# Patient Record
Sex: Male | Born: 1950 | Race: White | Hispanic: No | Smoking: Current every day smoker
Health system: Southern US, Community
[De-identification: ages and names within clinical notes are randomized; demographics above are authoritative.]

## PROBLEM LIST (undated history)

## (undated) DIAGNOSIS — IMO0002 Reserved for concepts with insufficient information to code with codable children: Secondary | ICD-10-CM

## (undated) DIAGNOSIS — H269 Unspecified cataract: Secondary | ICD-10-CM

## (undated) DIAGNOSIS — F32A Depression, unspecified: Secondary | ICD-10-CM

## (undated) DIAGNOSIS — Z5189 Encounter for other specified aftercare: Secondary | ICD-10-CM

## (undated) DIAGNOSIS — H409 Unspecified glaucoma: Secondary | ICD-10-CM

## (undated) DIAGNOSIS — M1990 Unspecified osteoarthritis, unspecified site: Secondary | ICD-10-CM

## (undated) DIAGNOSIS — K219 Gastro-esophageal reflux disease without esophagitis: Secondary | ICD-10-CM

## (undated) DIAGNOSIS — I10 Essential (primary) hypertension: Secondary | ICD-10-CM

## (undated) DIAGNOSIS — T7840XA Allergy, unspecified, initial encounter: Secondary | ICD-10-CM

## (undated) DIAGNOSIS — G8929 Other chronic pain: Secondary | ICD-10-CM

## (undated) DIAGNOSIS — N189 Chronic kidney disease, unspecified: Secondary | ICD-10-CM

## (undated) DIAGNOSIS — G600 Hereditary motor and sensory neuropathy: Secondary | ICD-10-CM

## (undated) HISTORY — DX: Reserved for concepts with insufficient information to code with codable children: IMO0002

## (undated) HISTORY — DX: Encounter for other specified aftercare: Z51.89

## (undated) HISTORY — DX: Allergy, unspecified, initial encounter: T78.40XA

## (undated) HISTORY — PX: NECK SURGERY: SHX720

## (undated) HISTORY — DX: Unspecified glaucoma: H40.9

## (undated) HISTORY — PX: ADENOIDECTOMY: SUR15

## (undated) HISTORY — PX: FOOT SURGERY: SHX648

## (undated) HISTORY — DX: Unspecified osteoarthritis, unspecified site: M19.90

## (undated) HISTORY — DX: Unspecified cataract: H26.9

## (undated) HISTORY — PX: TONSILLECTOMY: SUR1361

## (undated) HISTORY — PX: FRACTURE SURGERY: SHX138

## (undated) HISTORY — PX: HERNIA REPAIR: SHX51

---

## 2005-08-09 ENCOUNTER — Encounter: Admission: RE | Admit: 2005-08-09 | Discharge: 2005-11-07 | Payer: Self-pay | Admitting: Psychiatry

## 2005-08-20 ENCOUNTER — Ambulatory Visit: Payer: Self-pay | Admitting: Internal Medicine

## 2005-09-05 ENCOUNTER — Ambulatory Visit: Payer: Self-pay | Admitting: Internal Medicine

## 2005-09-19 ENCOUNTER — Ambulatory Visit: Payer: Self-pay | Admitting: Internal Medicine

## 2005-09-26 ENCOUNTER — Ambulatory Visit: Payer: Self-pay | Admitting: Internal Medicine

## 2005-10-26 ENCOUNTER — Ambulatory Visit: Payer: Self-pay | Admitting: Internal Medicine

## 2005-10-26 ENCOUNTER — Ambulatory Visit (HOSPITAL_COMMUNITY): Admission: RE | Admit: 2005-10-26 | Discharge: 2005-10-26 | Payer: Self-pay | Admitting: Internal Medicine

## 2005-12-12 ENCOUNTER — Ambulatory Visit: Payer: Self-pay | Admitting: Internal Medicine

## 2005-12-19 ENCOUNTER — Ambulatory Visit: Payer: Self-pay | Admitting: Internal Medicine

## 2005-12-26 ENCOUNTER — Ambulatory Visit: Payer: Self-pay | Admitting: Internal Medicine

## 2005-12-31 ENCOUNTER — Ambulatory Visit (HOSPITAL_COMMUNITY): Admission: RE | Admit: 2005-12-31 | Discharge: 2005-12-31 | Payer: Self-pay | Admitting: Internal Medicine

## 2006-01-23 ENCOUNTER — Ambulatory Visit: Payer: Self-pay | Admitting: Internal Medicine

## 2006-08-22 ENCOUNTER — Ambulatory Visit (HOSPITAL_COMMUNITY): Admission: RE | Admit: 2006-08-22 | Discharge: 2006-08-22 | Payer: Self-pay | Admitting: Internal Medicine

## 2006-08-22 ENCOUNTER — Ambulatory Visit: Payer: Self-pay | Admitting: Internal Medicine

## 2006-08-30 ENCOUNTER — Ambulatory Visit (HOSPITAL_COMMUNITY): Admission: RE | Admit: 2006-08-30 | Discharge: 2006-08-30 | Payer: Self-pay | Admitting: Internal Medicine

## 2006-10-24 ENCOUNTER — Ambulatory Visit: Payer: Self-pay | Admitting: Internal Medicine

## 2007-07-17 ENCOUNTER — Encounter: Payer: Self-pay | Admitting: *Deleted

## 2007-07-17 DIAGNOSIS — F3289 Other specified depressive episodes: Secondary | ICD-10-CM | POA: Insufficient documentation

## 2007-07-17 DIAGNOSIS — K219 Gastro-esophageal reflux disease without esophagitis: Secondary | ICD-10-CM | POA: Insufficient documentation

## 2007-07-17 DIAGNOSIS — D649 Anemia, unspecified: Secondary | ICD-10-CM

## 2007-07-17 DIAGNOSIS — F329 Major depressive disorder, single episode, unspecified: Secondary | ICD-10-CM | POA: Insufficient documentation

## 2007-07-17 DIAGNOSIS — Z9189 Other specified personal risk factors, not elsewhere classified: Secondary | ICD-10-CM | POA: Insufficient documentation

## 2013-04-14 ENCOUNTER — Other Ambulatory Visit: Payer: Self-pay | Admitting: Pain Medicine

## 2013-04-14 DIAGNOSIS — M79605 Pain in left leg: Secondary | ICD-10-CM

## 2013-04-14 DIAGNOSIS — M545 Low back pain: Secondary | ICD-10-CM

## 2013-04-27 ENCOUNTER — Ambulatory Visit
Admission: RE | Admit: 2013-04-27 | Discharge: 2013-04-27 | Disposition: A | Discharge status: Home / Self Care | Payer: Medicare Other | Source: Ambulatory Visit | Attending: Pain Medicine | Admitting: Pain Medicine

## 2013-04-27 DIAGNOSIS — M545 Low back pain: Secondary | ICD-10-CM

## 2013-04-27 DIAGNOSIS — M79604 Pain in right leg: Secondary | ICD-10-CM

## 2014-07-05 DIAGNOSIS — M48061 Spinal stenosis, lumbar region without neurogenic claudication: Secondary | ICD-10-CM | POA: Insufficient documentation

## 2014-07-05 DIAGNOSIS — M51369 Other intervertebral disc degeneration, lumbar region without mention of lumbar back pain or lower extremity pain: Secondary | ICD-10-CM | POA: Insufficient documentation

## 2014-07-05 DIAGNOSIS — H9201 Otalgia, right ear: Secondary | ICD-10-CM | POA: Insufficient documentation

## 2014-10-29 DIAGNOSIS — R233 Spontaneous ecchymoses: Secondary | ICD-10-CM | POA: Insufficient documentation

## 2015-01-05 DIAGNOSIS — G8929 Other chronic pain: Secondary | ICD-10-CM | POA: Insufficient documentation

## 2015-07-12 ENCOUNTER — Encounter: Payer: Self-pay | Admitting: Family Medicine

## 2015-07-29 ENCOUNTER — Encounter: Payer: Self-pay | Admitting: Family Medicine

## 2016-02-06 DIAGNOSIS — M5430 Sciatica, unspecified side: Secondary | ICD-10-CM | POA: Diagnosis not present

## 2016-02-06 DIAGNOSIS — N200 Calculus of kidney: Secondary | ICD-10-CM | POA: Diagnosis not present

## 2016-02-06 DIAGNOSIS — R197 Diarrhea, unspecified: Secondary | ICD-10-CM | POA: Diagnosis not present

## 2016-02-06 DIAGNOSIS — G4700 Insomnia, unspecified: Secondary | ICD-10-CM | POA: Diagnosis not present

## 2016-03-19 DIAGNOSIS — Z1211 Encounter for screening for malignant neoplasm of colon: Secondary | ICD-10-CM | POA: Diagnosis not present

## 2016-06-26 DIAGNOSIS — G600 Hereditary motor and sensory neuropathy: Secondary | ICD-10-CM | POA: Diagnosis not present

## 2016-06-26 DIAGNOSIS — G4700 Insomnia, unspecified: Secondary | ICD-10-CM | POA: Diagnosis not present

## 2016-06-26 DIAGNOSIS — M5430 Sciatica, unspecified side: Secondary | ICD-10-CM | POA: Diagnosis not present

## 2016-06-26 DIAGNOSIS — R030 Elevated blood-pressure reading, without diagnosis of hypertension: Secondary | ICD-10-CM | POA: Diagnosis not present

## 2016-07-11 DIAGNOSIS — Z23 Encounter for immunization: Secondary | ICD-10-CM | POA: Diagnosis not present

## 2016-08-24 DIAGNOSIS — M810 Age-related osteoporosis without current pathological fracture: Secondary | ICD-10-CM | POA: Diagnosis not present

## 2016-08-24 DIAGNOSIS — G8929 Other chronic pain: Secondary | ICD-10-CM | POA: Diagnosis not present

## 2016-08-24 DIAGNOSIS — G479 Sleep disorder, unspecified: Secondary | ICD-10-CM | POA: Diagnosis not present

## 2016-08-24 DIAGNOSIS — G600 Hereditary motor and sensory neuropathy: Secondary | ICD-10-CM | POA: Diagnosis not present

## 2016-08-24 DIAGNOSIS — I10 Essential (primary) hypertension: Secondary | ICD-10-CM | POA: Diagnosis not present

## 2016-08-24 DIAGNOSIS — Z136 Encounter for screening for cardiovascular disorders: Secondary | ICD-10-CM | POA: Diagnosis not present

## 2016-08-24 DIAGNOSIS — N400 Enlarged prostate without lower urinary tract symptoms: Secondary | ICD-10-CM | POA: Diagnosis not present

## 2017-02-22 DIAGNOSIS — K219 Gastro-esophageal reflux disease without esophagitis: Secondary | ICD-10-CM | POA: Diagnosis not present

## 2017-02-22 DIAGNOSIS — R030 Elevated blood-pressure reading, without diagnosis of hypertension: Secondary | ICD-10-CM | POA: Diagnosis not present

## 2017-02-22 DIAGNOSIS — G894 Chronic pain syndrome: Secondary | ICD-10-CM | POA: Diagnosis not present

## 2017-02-22 DIAGNOSIS — R944 Abnormal results of kidney function studies: Secondary | ICD-10-CM | POA: Diagnosis not present

## 2017-02-22 DIAGNOSIS — M5417 Radiculopathy, lumbosacral region: Secondary | ICD-10-CM | POA: Diagnosis not present

## 2017-02-22 DIAGNOSIS — R296 Repeated falls: Secondary | ICD-10-CM | POA: Diagnosis not present

## 2017-04-01 ENCOUNTER — Other Ambulatory Visit: Payer: Self-pay | Admitting: Internal Medicine

## 2017-04-01 DIAGNOSIS — M810 Age-related osteoporosis without current pathological fracture: Secondary | ICD-10-CM | POA: Diagnosis not present

## 2017-04-01 DIAGNOSIS — N400 Enlarged prostate without lower urinary tract symptoms: Secondary | ICD-10-CM | POA: Diagnosis not present

## 2017-04-01 DIAGNOSIS — R030 Elevated blood-pressure reading, without diagnosis of hypertension: Secondary | ICD-10-CM | POA: Diagnosis not present

## 2017-04-01 DIAGNOSIS — N281 Cyst of kidney, acquired: Secondary | ICD-10-CM | POA: Diagnosis not present

## 2017-04-01 DIAGNOSIS — N433 Hydrocele, unspecified: Secondary | ICD-10-CM | POA: Diagnosis not present

## 2017-04-01 DIAGNOSIS — R296 Repeated falls: Secondary | ICD-10-CM | POA: Diagnosis not present

## 2017-04-01 DIAGNOSIS — G8929 Other chronic pain: Secondary | ICD-10-CM | POA: Diagnosis not present

## 2017-04-01 DIAGNOSIS — K219 Gastro-esophageal reflux disease without esophagitis: Secondary | ICD-10-CM | POA: Diagnosis not present

## 2017-04-04 ENCOUNTER — Other Ambulatory Visit: Payer: Medicare Other

## 2017-04-09 ENCOUNTER — Ambulatory Visit
Admission: RE | Admit: 2017-04-09 | Discharge: 2017-04-09 | Disposition: A | Discharge status: Home / Self Care | Payer: Medicare Other | Source: Ambulatory Visit | Attending: Internal Medicine | Admitting: Internal Medicine

## 2017-04-09 DIAGNOSIS — N433 Hydrocele, unspecified: Secondary | ICD-10-CM

## 2017-04-19 DIAGNOSIS — G600 Hereditary motor and sensory neuropathy: Secondary | ICD-10-CM | POA: Diagnosis not present

## 2017-04-19 DIAGNOSIS — Z79899 Other long term (current) drug therapy: Secondary | ICD-10-CM | POA: Diagnosis not present

## 2017-04-19 DIAGNOSIS — G894 Chronic pain syndrome: Secondary | ICD-10-CM | POA: Diagnosis not present

## 2017-04-19 DIAGNOSIS — M545 Low back pain: Secondary | ICD-10-CM | POA: Diagnosis not present

## 2017-04-19 DIAGNOSIS — Z79891 Long term (current) use of opiate analgesic: Secondary | ICD-10-CM | POA: Diagnosis not present

## 2017-04-23 DIAGNOSIS — N430 Encysted hydrocele: Secondary | ICD-10-CM | POA: Diagnosis not present

## 2017-06-20 DIAGNOSIS — G8929 Other chronic pain: Secondary | ICD-10-CM | POA: Diagnosis not present

## 2017-06-20 DIAGNOSIS — G600 Hereditary motor and sensory neuropathy: Secondary | ICD-10-CM | POA: Diagnosis not present

## 2017-06-20 DIAGNOSIS — K219 Gastro-esophageal reflux disease without esophagitis: Secondary | ICD-10-CM | POA: Diagnosis not present

## 2017-08-19 DIAGNOSIS — Z23 Encounter for immunization: Secondary | ICD-10-CM | POA: Diagnosis not present

## 2017-08-19 DIAGNOSIS — K219 Gastro-esophageal reflux disease without esophagitis: Secondary | ICD-10-CM | POA: Diagnosis not present

## 2017-08-19 DIAGNOSIS — M810 Age-related osteoporosis without current pathological fracture: Secondary | ICD-10-CM | POA: Diagnosis not present

## 2017-08-19 DIAGNOSIS — G8929 Other chronic pain: Secondary | ICD-10-CM | POA: Diagnosis not present

## 2017-08-19 DIAGNOSIS — N400 Enlarged prostate without lower urinary tract symptoms: Secondary | ICD-10-CM | POA: Diagnosis not present

## 2017-08-19 DIAGNOSIS — G600 Hereditary motor and sensory neuropathy: Secondary | ICD-10-CM | POA: Diagnosis not present

## 2017-08-19 DIAGNOSIS — E785 Hyperlipidemia, unspecified: Secondary | ICD-10-CM | POA: Diagnosis not present

## 2017-08-19 DIAGNOSIS — Z0000 Encounter for general adult medical examination without abnormal findings: Secondary | ICD-10-CM | POA: Diagnosis not present

## 2017-08-19 DIAGNOSIS — N433 Hydrocele, unspecified: Secondary | ICD-10-CM | POA: Diagnosis not present

## 2017-08-19 DIAGNOSIS — R296 Repeated falls: Secondary | ICD-10-CM | POA: Diagnosis not present

## 2017-08-22 ENCOUNTER — Ambulatory Visit (INDEPENDENT_AMBULATORY_CARE_PROVIDER_SITE_OTHER): Payer: Medicare Other

## 2017-08-22 ENCOUNTER — Ambulatory Visit (INDEPENDENT_AMBULATORY_CARE_PROVIDER_SITE_OTHER): Payer: Medicare Other | Admitting: Orthopaedic Surgery

## 2017-08-22 ENCOUNTER — Ambulatory Visit (INDEPENDENT_AMBULATORY_CARE_PROVIDER_SITE_OTHER): Payer: Self-pay

## 2017-08-22 DIAGNOSIS — M25561 Pain in right knee: Secondary | ICD-10-CM | POA: Diagnosis not present

## 2017-08-22 DIAGNOSIS — M25551 Pain in right hip: Secondary | ICD-10-CM | POA: Diagnosis not present

## 2017-08-22 DIAGNOSIS — M25562 Pain in left knee: Secondary | ICD-10-CM

## 2017-08-22 DIAGNOSIS — G8929 Other chronic pain: Secondary | ICD-10-CM

## 2017-08-22 DIAGNOSIS — M25552 Pain in left hip: Secondary | ICD-10-CM

## 2017-08-22 NOTE — Progress Notes (Signed)
Office Visit Note   Patient: Manuel Castro           Date of Birth: 1951/11/07           MRN: [id number] Visit Date: 08/22/2017              Requested by: Lorenda Ishihara, MD [address] [place], [place] [address] PCP: Lorenda Ishihara, MD   Assessment & Plan: Visit Diagnoses:  1. Chronic pain of both knees   2. Pain of both hip joints     Plan: Overall impression is right hip abductor tendinitis and right knee medial meniscal degenerative tear. Patient will continue doing home exercises with the hip. He declined a cortisone injection with the right knee. Questions encouraged and answered. Follow-up as needed.  Follow-Up Instructions: Return if symptoms worsen or fail to improve.   Orders:  Orders Placed This Encounter  Procedures  . XR Pelvis 1-2 Views  . XR KNEE 3 VIEW LEFT  . XR KNEE 3 VIEW RIGHT   No orders of the defined types were placed in this encounter.     Procedures: No procedures performed   Clinical Data: No additional findings.   Subjective: No chief complaint on file.   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 Terms of right hip he endorses pain in the abductor region that is worse with walking. Denies any numbness or tingling. He denies any groin pain. He also endorses medial right knee pain. Denies any numbness and tingling. Pain is worse with activity and with walking.    Review of Systems  Constitutional: Negative.   All other systems reviewed and are negative.    Objective: Vital Signs: There were no vitals taken for this visit.  Physical Exam  Constitutional: He is oriented to person, place, and time. He appears well-developed and well-nourished.  HENT:  Head: Normocephalic and atraumatic.  Eyes: Pupils are equal, round, and reactive to light.  Neck: Neck supple.  Pulmonary/Chest: Effort normal.  Abdominal: Soft.    Musculoskeletal: Normal range of motion.  Neurological: He is alert and oriented to person, place, and time.  Skin: Skin is warm.  Psychiatric: He has a normal mood and affect. His behavior is normal. Judgment and thought content normal.  Nursing note and vitals reviewed.   Ortho Exam Right hip exam shows painless range of motion of the hip. Trochanteric bursa is nontender. He is more tender with deep palpation in the abductor muscles.  Right knee exam shows no joint effusion. Excellent range of motion. Medial joint line tenderness. Collaterals and cruciates are stable. Specialty Comments:  No specialty comments available.  Imaging: Xr Knee 3 View Left  Result Date: 08/22/2017 No significant degenerative joint disease  Xr Knee 3 View Right  Result Date: 08/22/2017 No significant degenerative joint disease  Xr Pelvis 1-2 Views  Result Date: 08/22/2017 Mild osteoarthritis    PMFS History: Patient Active Problem List   Diagnosis Date Noted  . Chronic pain of both knees 08/22/2017  . Pain of both hip joints 08/22/2017  . ANEMIA-NOS 07/17/2007  . DEPRESSION 07/17/2007  . GERD 07/17/2007  . BREAST BIOPSY, HX OF 07/17/2007   No past medical history on file.  No family history on file.  No past surgical history on file. Social History   Occupational History  . Not on file.   Social History Main Topics  . Smoking status: Not on  file  . Smokeless tobacco: Not on file  . Alcohol use Not on file  . Drug use: Unknown  . Sexual activity: Not on file

## 2017-09-09 DIAGNOSIS — R12 Heartburn: Secondary | ICD-10-CM | POA: Diagnosis not present

## 2017-09-09 DIAGNOSIS — K219 Gastro-esophageal reflux disease without esophagitis: Secondary | ICD-10-CM | POA: Diagnosis not present

## 2017-10-24 DIAGNOSIS — K2980 Duodenitis without bleeding: Secondary | ICD-10-CM | POA: Diagnosis not present

## 2017-10-24 DIAGNOSIS — K319 Disease of stomach and duodenum, unspecified: Secondary | ICD-10-CM | POA: Diagnosis not present

## 2017-10-24 DIAGNOSIS — K219 Gastro-esophageal reflux disease without esophagitis: Secondary | ICD-10-CM | POA: Diagnosis not present

## 2017-10-24 DIAGNOSIS — R12 Heartburn: Secondary | ICD-10-CM | POA: Diagnosis not present

## 2017-10-24 DIAGNOSIS — K269 Duodenal ulcer, unspecified as acute or chronic, without hemorrhage or perforation: Secondary | ICD-10-CM | POA: Diagnosis not present

## 2017-10-24 DIAGNOSIS — K2900 Acute gastritis without bleeding: Secondary | ICD-10-CM | POA: Diagnosis not present

## 2017-10-24 DIAGNOSIS — K3189 Other diseases of stomach and duodenum: Secondary | ICD-10-CM | POA: Diagnosis not present

## 2017-10-24 LAB — HM COLONOSCOPY

## 2017-10-29 DIAGNOSIS — K319 Disease of stomach and duodenum, unspecified: Secondary | ICD-10-CM | POA: Diagnosis not present

## 2019-04-08 DIAGNOSIS — Z7189 Other specified counseling: Secondary | ICD-10-CM | POA: Diagnosis not present

## 2019-04-08 DIAGNOSIS — N401 Enlarged prostate with lower urinary tract symptoms: Secondary | ICD-10-CM | POA: Diagnosis not present

## 2019-04-08 DIAGNOSIS — K259 Gastric ulcer, unspecified as acute or chronic, without hemorrhage or perforation: Secondary | ICD-10-CM | POA: Diagnosis not present

## 2019-04-08 DIAGNOSIS — M25551 Pain in right hip: Secondary | ICD-10-CM | POA: Insufficient documentation

## 2019-04-08 DIAGNOSIS — F331 Major depressive disorder, recurrent, moderate: Secondary | ICD-10-CM | POA: Diagnosis not present

## 2019-04-08 DIAGNOSIS — F3341 Major depressive disorder, recurrent, in partial remission: Secondary | ICD-10-CM | POA: Insufficient documentation

## 2019-04-08 DIAGNOSIS — G600 Hereditary motor and sensory neuropathy: Secondary | ICD-10-CM | POA: Diagnosis not present

## 2019-04-08 DIAGNOSIS — M170 Bilateral primary osteoarthritis of knee: Secondary | ICD-10-CM | POA: Diagnosis not present

## 2019-04-08 DIAGNOSIS — M48061 Spinal stenosis, lumbar region without neurogenic claudication: Secondary | ICD-10-CM | POA: Diagnosis not present

## 2019-04-08 DIAGNOSIS — N400 Enlarged prostate without lower urinary tract symptoms: Secondary | ICD-10-CM | POA: Insufficient documentation

## 2019-04-08 DIAGNOSIS — H409 Unspecified glaucoma: Secondary | ICD-10-CM | POA: Insufficient documentation

## 2019-07-15 DIAGNOSIS — G4700 Insomnia, unspecified: Secondary | ICD-10-CM | POA: Insufficient documentation

## 2019-07-15 DIAGNOSIS — F17200 Nicotine dependence, unspecified, uncomplicated: Secondary | ICD-10-CM | POA: Insufficient documentation

## 2019-07-22 DIAGNOSIS — G7100 Muscular dystrophy, unspecified: Secondary | ICD-10-CM | POA: Insufficient documentation

## 2019-12-05 ENCOUNTER — Ambulatory Visit: Payer: Medicare Other | Attending: Internal Medicine

## 2019-12-05 DIAGNOSIS — Z23 Encounter for immunization: Secondary | ICD-10-CM

## 2019-12-05 NOTE — Progress Notes (Signed)
   Covid-19 Vaccination Clinic  Name:  Manuel Castro    MRN: [id number] DOB: 02/24/1951  12/05/2019  Manuel Castro was observed post Covid-19 immunization for 15 minutes without incidence. He was provided with Vaccine Information Sheet and instruction to access the V-Safe system.   Manuel Castro was instructed to call 911 with any severe reactions post vaccine: Marland Kitchen Difficulty breathing  . Swelling of your face and throat  . A fast heartbeat  . A bad rash all over your body  . Dizziness and weakness    Immunizations Administered    Name Date Dose VIS Date Route   Pfizer COVID-19 Vaccine 12/05/2019  1:18 PM 0.3 mL 10/23/2019 Intramuscular   Manufacturer: [employer], [employer]   Lot: [id number]   NDC: [address]-2

## 2019-12-26 ENCOUNTER — Ambulatory Visit: Payer: Medicare HMO | Attending: Internal Medicine

## 2019-12-26 DIAGNOSIS — Z23 Encounter for immunization: Secondary | ICD-10-CM | POA: Insufficient documentation

## 2019-12-26 NOTE — Progress Notes (Signed)
   Covid-19 Vaccination Clinic  Name:  Manuel Castro    MRN: [id number] DOB: 11/06/1951  12/26/2019  Manuel Castro was observed post Covid-19 immunization for 15 minutes without incidence. He was provided with Vaccine Information Sheet and instruction to access the V-Safe system.   Manuel Castro was instructed to call 911 with any severe reactions post vaccine: Marland Kitchen Difficulty breathing  . Swelling of your face and throat  . A fast heartbeat  . A bad rash all over your body  . Dizziness and weakness    Immunizations Administered    Name Date Dose VIS Date Route   Pfizer COVID-19 Vaccine 12/26/2019 11:28 AM 0.3 mL 10/23/2019 Intramuscular   Manufacturer: [employer], [employer]   Lot: [id number]   NDC: [address]-8

## 2020-01-11 DIAGNOSIS — G629 Polyneuropathy, unspecified: Secondary | ICD-10-CM | POA: Insufficient documentation

## 2020-01-11 DIAGNOSIS — Z9181 History of falling: Secondary | ICD-10-CM | POA: Insufficient documentation

## 2020-01-12 DIAGNOSIS — N1831 Chronic kidney disease, stage 3a: Secondary | ICD-10-CM | POA: Insufficient documentation

## 2020-06-22 DIAGNOSIS — D72819 Decreased white blood cell count, unspecified: Secondary | ICD-10-CM | POA: Insufficient documentation

## 2020-07-01 ENCOUNTER — Other Ambulatory Visit: Payer: Self-pay | Admitting: Family Medicine

## 2020-07-01 DIAGNOSIS — M48061 Spinal stenosis, lumbar region without neurogenic claudication: Secondary | ICD-10-CM

## 2020-07-19 ENCOUNTER — Other Ambulatory Visit: Payer: Self-pay | Admitting: Family Medicine

## 2020-07-21 ENCOUNTER — Other Ambulatory Visit: Payer: Self-pay | Admitting: Family Medicine

## 2020-07-23 ENCOUNTER — Ambulatory Visit
Admission: RE | Admit: 2020-07-23 | Discharge: 2020-07-23 | Disposition: A | Discharge status: Home / Self Care | Payer: Medicare HMO | Source: Ambulatory Visit | Attending: Family Medicine | Admitting: Family Medicine

## 2020-07-23 DIAGNOSIS — M48061 Spinal stenosis, lumbar region without neurogenic claudication: Secondary | ICD-10-CM

## 2020-07-23 MED ORDER — GADOBENATE DIMEGLUMINE 529 MG/ML IV SOLN
12.0000 mL | Freq: Once | INTRAVENOUS | Status: AC | PRN
  Administered 2020-07-23: 12 mL via INTRAVENOUS

## 2020-07-26 DIAGNOSIS — N261 Atrophy of kidney (terminal): Secondary | ICD-10-CM | POA: Insufficient documentation

## 2020-12-28 DIAGNOSIS — E860 Dehydration: Secondary | ICD-10-CM | POA: Insufficient documentation

## 2021-01-20 DIAGNOSIS — I10 Essential (primary) hypertension: Secondary | ICD-10-CM | POA: Insufficient documentation

## 2021-02-07 ENCOUNTER — Emergency Department (HOSPITAL_COMMUNITY): Payer: Medicare HMO

## 2021-02-07 ENCOUNTER — Other Ambulatory Visit: Payer: Self-pay

## 2021-02-07 ENCOUNTER — Emergency Department (HOSPITAL_COMMUNITY)
Admission: EM | Admit: 2021-02-07 | Discharge: 2021-02-07 | Disposition: A | Discharge status: Home / Self Care | Payer: Medicare HMO | Attending: Emergency Medicine | Admitting: Emergency Medicine

## 2021-02-07 ENCOUNTER — Encounter (HOSPITAL_COMMUNITY): Payer: Self-pay

## 2021-02-07 DIAGNOSIS — R748 Abnormal levels of other serum enzymes: Secondary | ICD-10-CM | POA: Insufficient documentation

## 2021-02-07 DIAGNOSIS — R1031 Right lower quadrant pain: Secondary | ICD-10-CM | POA: Diagnosis present

## 2021-02-07 DIAGNOSIS — K859 Acute pancreatitis without necrosis or infection, unspecified: Secondary | ICD-10-CM | POA: Diagnosis not present

## 2021-02-07 DIAGNOSIS — F17210 Nicotine dependence, cigarettes, uncomplicated: Secondary | ICD-10-CM | POA: Insufficient documentation

## 2021-02-07 HISTORY — DX: Hereditary motor and sensory neuropathy: G60.0

## 2021-02-07 LAB — COMPREHENSIVE METABOLIC PANEL
ALT: 11 U/L (ref 0–44)
AST: 25 U/L (ref 15–41)
Albumin: 3 g/dL — ABNORMAL LOW (ref 3.5–5.0)
Alkaline Phosphatase: 114 U/L (ref 38–126)
Anion gap: 9 (ref 5–15)
BUN: 23 mg/dL (ref 8–23)
CO2: 25 mmol/L (ref 22–32)
Calcium: 8.5 mg/dL — ABNORMAL LOW (ref 8.9–10.3)
Chloride: 104 mmol/L (ref 98–111)
Creatinine, Ser: 1.14 mg/dL (ref 0.61–1.24)
GFR, Estimated: >60 mL/min (ref >60)
Glucose, Bld: 112 mg/dL — ABNORMAL HIGH (ref 70–99)
Potassium: 4.5 mmol/L (ref 3.5–5.1)
Sodium: 138 mmol/L (ref 135–145)
Total Bilirubin: 1 mg/dL (ref 0.3–1.2)
Total Protein: 6.2 g/dL — ABNORMAL LOW (ref 6.5–8.1)

## 2021-02-07 LAB — I-STAT CHEM 8, ED
BUN: 21 mg/dL (ref 8–23)
Calcium, Ion: 1.23 mmol/L (ref 1.15–1.40)
Chloride: 103 mmol/L (ref 98–111)
Creatinine, Ser: 0.9 mg/dL (ref 0.61–1.24)
Glucose, Bld: 101 mg/dL — ABNORMAL HIGH (ref 70–99)
HCT: 31 % — ABNORMAL LOW (ref 39.0–52.0)
Hemoglobin: 10.5 g/dL — ABNORMAL LOW (ref 13.0–17.0)
Potassium: 4.2 mmol/L (ref 3.5–5.1)
Sodium: 138 mmol/L (ref 135–145)
TCO2: 25 mmol/L (ref 22–32)

## 2021-02-07 LAB — URINALYSIS, ROUTINE W REFLEX MICROSCOPIC
Bilirubin Urine: NEGATIVE
Glucose, UA: NEGATIVE mg/dL
Hgb urine dipstick: NEGATIVE
Ketones, ur: 5 mg/dL — AB
Leukocytes,Ua: NEGATIVE
Nitrite: NEGATIVE
Protein, ur: 100 mg/dL — AB
Specific Gravity, Urine: 1.044 — ABNORMAL HIGH (ref 1.005–1.030)
pH: 6 (ref 5.0–8.0)

## 2021-02-07 LAB — CBC
HCT: 33.3 % — ABNORMAL LOW (ref 39.0–52.0)
Hemoglobin: 11.1 g/dL — ABNORMAL LOW (ref 13.0–17.0)
MCH: 32.2 pg (ref 26.0–34.0)
MCHC: 33.3 g/dL (ref 30.0–36.0)
MCV: 96.5 fL (ref 80.0–100.0)
Platelets: 202 10*3/uL (ref 150–400)
RBC: 3.45 MIL/uL — ABNORMAL LOW (ref 4.22–5.81)
RDW: 12.9 % (ref 11.5–15.5)
WBC: 6.7 10*3/uL (ref 4.0–10.5)
nRBC: 0 % (ref 0.0–0.2)

## 2021-02-07 LAB — LIPASE, BLOOD: Lipase: 158 U/L — ABNORMAL HIGH (ref 11–51)

## 2021-02-07 MED ORDER — MORPHINE SULFATE (PF) 4 MG/ML IV SOLN
4.0000 mg | Freq: Once | INTRAVENOUS | Status: AC
  Administered 2021-02-07: 4 mg via INTRAVENOUS
  Filled 2021-02-07: qty 1

## 2021-02-07 MED ORDER — IOHEXOL 300 MG/ML  SOLN
100.0000 mL | Freq: Once | INTRAMUSCULAR | Status: AC | PRN
  Administered 2021-02-07: 100 mL via INTRAVENOUS

## 2021-02-07 MED ORDER — ONDANSETRON 4 MG PO TBDP: ORAL_TABLET | ORAL | 0 refills | Status: DC

## 2021-02-07 MED ORDER — MORPHINE SULFATE 15 MG PO TABS
7.5000 mg | ORAL_TABLET | ORAL | 0 refills | Status: DC | PRN
Start: 2021-02-07 — End: 2021-02-22

## 2021-02-07 MED ORDER — ONDANSETRON HCL 4 MG/2ML IJ SOLN
4.0000 mg | Freq: Once | INTRAMUSCULAR | Status: AC
  Administered 2021-02-07: 4 mg via INTRAVENOUS
  Filled 2021-02-07: qty 2

## 2021-02-07 NOTE — ED Triage Notes (Signed)
Patient reports lower abdominal pain x 1 week, but RLQ worse today and nausea only today.

## 2021-02-07 NOTE — Discharge Instructions (Addendum)
Your CT scan and blood work was concerning for pancreatitis.  There is also some concern by the radiologist for possible cancer in the right lower portion of your abdomen, they also thought that this could be stool.  Please follow-up with your gastroenterologist for both problems.  As we had discussed this is something that sometimes will require you to come to the hospital if it worsens.  Please return at anytime you feel he need to stay.  Especially return for worsening pain or inability to eat or drink or fever.  I would stop your tramadol while you are taking the pain medicine I prescribed you.  Try and take max dose Tylenol along with this which would be 1000 mg or 2 extra strength tablets 4 times a day.

## 2021-02-07 NOTE — ED Notes (Signed)
Went to collect urine sample however patient is gone for testing. 

## 2021-02-07 NOTE — ED Provider Notes (Signed)
[facility]-EMERGENCY DEPT Provider Note   CSN: [id number] Arrival date & time: 02/07/21  4010     History Chief Complaint  Patient presents with  . Abdominal Pain  . Nausea    Manuel Castro is a 70 y.o. male.  70 yo M with a chief complaints of abdominal pain.  Going on for 3 or 4 days.  Pain initially was crampy associated with some nausea and vomiting.  Pain now is in the right side and having some continued nausea.  Denied history of pain like this before.  No sick contacts.  Concerned that he may have appendicitis.  Denies urinary symptoms denies pain to the penis or testicles.  The history is provided by the patient.  Abdominal Pain Pain location:  RLQ Pain quality: sharp and shooting   Pain radiates to:  Does not radiate Pain severity:  Moderate Onset quality:  Gradual Duration:  4 days Timing:  Constant Chronicity:  New Relieved by:  Nothing Worsened by:  Nothing Ineffective treatments:  None tried Associated symptoms: nausea and vomiting   Associated symptoms: no chest pain, no chills, no diarrhea, no fever and no shortness of breath        Past Medical History:  Diagnosis Date  . CMT (Charcot-Marie-Tooth disease)     Patient Active Problem List   Diagnosis Date Noted  . Chronic pain of both knees 08/22/2017  . Pain of both hip joints 08/22/2017  . ANEMIA-NOS 07/17/2007  . DEPRESSION 07/17/2007  . GERD 07/17/2007  . BREAST BIOPSY, HX OF 07/17/2007    Past Surgical History:  Procedure Laterality Date  . ADENOIDECTOMY    . FOOT SURGERY Bilateral   . HERNIA REPAIR    . NECK SURGERY    . TONSILLECTOMY         Family History  Problem Relation Age of Onset  . Cancer Mother   . Heart attack Mother   . Diabetes Father   . Heart attack Father     Social History   Tobacco Use  . Smoking status: Current Every Day Smoker    Packs/day: 0.35    Types: Cigarettes  . Smokeless tobacco: Never Used  Vaping Use  . Vaping Use:  Never used  Substance Use Topics  . Alcohol use: Yes  . Drug use: Not Currently    Home Medications Prior to Admission medications   Medication Sig Start Date End Date Taking? Authorizing Provider  morphine (MSIR) 15 MG tablet Take 0.5 tablets (7.5 mg total) by mouth every 4 (four) hours as needed for severe pain. 02/07/21  Yes Melene Plan, DO  ondansetron (ZOFRAN ODT) 4 MG disintegrating tablet 4mg  ODT q4 hours prn nausea/vomit 02/07/21  Yes 02/09/21, Ramiah Helfrich, DO  amLODipine (NORVASC) 5 MG tablet Take 5 mg by mouth daily. 01/20/21   [provider]  dorzolamide-timolol (COSOPT) 22.3-6.8 MG/ML ophthalmic solution Place 1 drop into both eyes 2 (two) times daily. 01/16/21   [provider]  gabapentin (NEURONTIN) 600 MG tablet Take 600 mg by mouth 3 (three) times daily. 11/18/20   [provider]  traMADol (ULTRAM) 50 MG tablet Take 50 mg by mouth 3 (three) times daily as needed for pain. 01/17/21   [provider]  traZODone (DESYREL) 100 MG tablet Take 100 mg by mouth at bedtime. 01/16/21   [provider]    Allergies    Patient has no known allergies.  Review of Systems   Review of Systems  Constitutional: Negative for  chills and fever.  HENT: Negative for congestion and facial swelling.   Eyes: Negative for discharge and visual disturbance.  Respiratory: Negative for shortness of breath.   Cardiovascular: Negative for chest pain and palpitations.  Gastrointestinal: Positive for abdominal pain, nausea and vomiting. Negative for diarrhea.  Musculoskeletal: Negative for arthralgias and myalgias.  Skin: Negative for color change and rash.  Neurological: Negative for tremors, syncope and headaches.  Psychiatric/Behavioral: Negative for confusion and dysphoric mood.    Physical Exam Updated Vital Signs BP (!) 172/83   Pulse (!) 109   Temp 98.3 F (36.8 C) (Oral)   Resp 18   Ht 5\' 8"  (1.727 m)   Wt 59 kg   SpO2 98%   BMI 19.77 kg/m   Physical  Exam Vitals and nursing note reviewed.  Constitutional:      Appearance: He is well-developed.  HENT:     Head: Normocephalic and atraumatic.  Eyes:     Pupils: Pupils are equal, round, and reactive to light.  Neck:     Vascular: No JVD.  Cardiovascular:     Rate and Rhythm: Normal rate and regular rhythm.     Heart sounds: No murmur heard. No friction rub. No gallop.   Pulmonary:     Effort: No respiratory distress.     Breath sounds: No wheezing.  Abdominal:     General: There is no distension.     Tenderness: There is abdominal tenderness in the right lower quadrant. There is no guarding or rebound.  Musculoskeletal:        General: Normal range of motion.     Cervical back: Normal range of motion and neck supple.  Skin:    Coloration: Skin is not pale.     Findings: No rash.  Neurological:     Mental Status: He is alert and oriented to person, place, and time.  Psychiatric:        Behavior: Behavior normal.     ED Results / Procedures / Treatments   Labs (all labs ordered are listed, but only abnormal results are displayed) Labs Reviewed  LIPASE, BLOOD - Abnormal; Notable for the following components:      Result Value   Lipase 158 (*)    All other components within normal limits  COMPREHENSIVE METABOLIC PANEL - Abnormal; Notable for the following components:   Glucose, Bld 112 (*)    Calcium 8.5 (*)    Total Protein 6.2 (*)    Albumin 3.0 (*)    All other components within normal limits  CBC - Abnormal; Notable for the following components:   RBC 3.45 (*)    Hemoglobin 11.1 (*)    HCT 33.3 (*)    All other components within normal limits  URINALYSIS, ROUTINE W REFLEX MICROSCOPIC - Abnormal; Notable for the following components:   Specific Gravity, Urine 1.044 (*)    Ketones, ur 5 (*)    Protein, ur 100 (*)    Bacteria, UA RARE (*)    All other components within normal limits  I-STAT CHEM 8, ED - Abnormal; Notable for the following components:   Glucose,  Bld 101 (*)    Hemoglobin 10.5 (*)    HCT 31.0 (*)    All other components within normal limits    EKG None  Radiology CT ABDOMEN PELVIS W CONTRAST  Result Date: 02/07/2021 CLINICAL DATA:  Right sided abdominal pain with nausea EXAM: CT ABDOMEN AND PELVIS WITH CONTRAST TECHNIQUE: Multidetector CT imaging of the  abdomen and pelvis was performed using the standard protocol following bolus administration of intravenous contrast. CONTRAST:  OMNIPAQUE IOHEXOL 300 MG/ML  SOLN COMPARISON:  Lumbar MRI July 23, 2020. FINDINGS: Lower chest: There is scarring and atelectatic change in the lung bases. No airspace consolidation in the lung bases. Hepatobiliary: No focal liver lesions are appreciable. Subtle lobulation along the contour of the liver noted. Gallbladder wall is not appreciably thickened. There is no appreciable biliary duct dilatation. Pancreas: There is mild dilatation of the pancreatic duct. There is a focal calcification in the mid pancreas. There is fluid located between the pancreas and lesser curvature of the stomach without well-defined pseudocyst. There is a 1 x 1 cm cystic appearing area at the junction of the head and body of the pancreas. Normal pancreatic enhancement throughout its course. Spleen: No splenic lesions are appreciable. 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 Adrenals/Urinary Tract: Adrenals bilaterally appear normal. The left kidney is markedly atrophic with localized dilatation of the left renal collecting system. There is a cyst arising from the upper pole of the atrophic left kidney measuring 1.2 x 1.2 cm. Right kidney shows a degree of compensatory hypertrophy. There is a cyst in the mid right kidney measuring 4.1 x 4.0 cm. There is a 9 x 8 mm cyst in the lower pole of the right kidney. There is a cyst arising in the mid to lower right kidney measuring 4.0 x 3.7 cm. No evident hydronephrosis on the right. No appreciable renal or  ureteral calculus on either side. Urinary bladder is midline with wall thickness within normal limits. Stomach/Bowel: There is moderate stool throughout the colon. There is a focal area of soft tissue opacity in the ascending colon measuring 4.9 x 3.6 cm. No similar appearance of this nature elsewhere in colon. There is no appreciable bowel wall or mesenteric thickening. No bowel obstruction is evident. No free air or portal venous air. The terminal ileum appears normal. Appendix appears normal. Vascular/Lymphatic: There is no abdominal aortic aneurysm. There are foci of aortic atherosclerosis. Major venous structures appear patent. No evident adenopathy in the abdomen or pelvis. Reproductive: Prostate and seminal vesicles normal in size and configuration. Right scrotal hydrocele incompletely visualized. Other: No evident abscess or ascites in the abdomen or pelvis. There is mild fat in the right inguinal ring. Musculoskeletal: There is degenerative change in the lumbar spine. There is mild anterior wedging of the L2 vertebral body, present on prior lumbar MR and grossly stable. No blastic or lytic bone lesions are evident. No intramuscular lesions. IMPRESSION: 1. Soft tissue opacity in the proximal ascending colon measuring 4.9 x 3.6 cm. While this area could represent inspissated stool, a colonic mass in this area cannot be excluded. Advise direct visualization of this region after appropriate colonic cleansing. 2. Findings indicative of pancreatitis with fluid tracking between the pancreas in the lesser curvature of the stomach. There is mild pancreatic duct dilatation. There is a 1 x 1 cm cystic appearing mass at the junction of the head and body of the pancreas. This finding likely represents a small pseudocyst. A small cystic neoplasm could present in this manner. This finding may warrant nonemergent MR of the pancreas pre and post-contrast to further evaluate. There is a focal calcification in the body of the  pancreas which may represent residua of chronic pancreatitis. No pancreatic necrosis evident. 3. Subtle lobulation of the liver contour raises question of underlying cirrhosis. No focal liver lesions evident.  4. Atrophic left kidney with mild hydronephrosis on the left. No obstructing focus noted in the left collecting system or ureter. Suspected compensatory hypertrophy right kidney. No right renal calculus or ureterectasis. Urinary bladder thickness within normal limits. 5.  Aortic Atherosclerosis (ICD10-I70.0). 6. 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 Stable wedging at L2. 7.  Incomplete visualization of right scrotal hydrocele. Electronically Signed   By: Bretta Bang III M.D.   On: 02/07/2021 12:39    Procedures Procedures   Medications Ordered in ED Medications  morphine 4 MG/ML injection 4 mg (4 mg Intravenous Given 02/07/21 1049)  ondansetron (ZOFRAN) injection 4 mg (4 mg Intravenous Given 02/07/21 1047)  iohexol (OMNIPAQUE) 300 MG/ML solution 100 mL (100 mLs Intravenous Contrast Given 02/07/21 1145)    ED Course  I have reviewed the triage vital signs and the nursing notes.  Pertinent labs & imaging results that were available during my care of the patient were reviewed by me and considered in my medical decision making (see chart for details).    MDM Rules/Calculators/A&P                          70 yo M with a chief complaints of right lower quadrant abdominal pain.  Ongoing for about 4 days now.  Will obtain blood work CT scan and reassess.    Blood work with elevated lipase level.  CT scan concerning for acute pancreatitis.  There is also a finding in the right lower quadrant that could be stool versus a mass.  I discussed the results with the patient.  He is feeling much better after his pain and nausea medicine here.  Would like to do a trial of home therapy and will follow up with GI in the office.  1:11 PM:  I have discussed the diagnosis/risks/treatment options with  the patient and believe the pt to be eligible for discharge home to follow-up with PCP, GI. We also discussed returning to the ED immediately if new or worsening sx occur. We discussed the sx which are most concerning (e.g., sudden worsening pain, fever, inability to tolerate by mouth) that necessitate immediate return. Medications administered to the patient during their visit and any new prescriptions provided to the patient are listed below.  Medications given during this visit Medications  morphine 4 MG/ML injection 4 mg (4 mg Intravenous Given 02/07/21 1049)  ondansetron (ZOFRAN) injection 4 mg (4 mg Intravenous Given 02/07/21 1047)  iohexol (OMNIPAQUE) 300 MG/ML solution 100 mL (100 mLs Intravenous Contrast Given 02/07/21 1145)     The patient appears reasonably screen and/or stabilized for discharge and I doubt any other medical condition or other [facility] requiring further screening, evaluation, or treatment in the ED at this time prior to discharge.    Final Clinical Impression(s) / ED Diagnoses Final diagnoses:  Acute pancreatitis, unspecified complication status, unspecified pancreatitis type    Rx / DC Orders ED Discharge Orders         Ordered    morphine (MSIR) 15 MG tablet  Every 4 hours PRN        02/07/21 1309    ondansetron (ZOFRAN ODT) 4 MG disintegrating tablet        02/07/21 1309           Melene Plan, DO 02/07/21 1311

## 2021-02-08 DIAGNOSIS — I700 Atherosclerosis of aorta: Secondary | ICD-10-CM | POA: Insufficient documentation

## 2021-02-08 DIAGNOSIS — K861 Other chronic pancreatitis: Secondary | ICD-10-CM | POA: Insufficient documentation

## 2021-02-15 DIAGNOSIS — E639 Nutritional deficiency, unspecified: Secondary | ICD-10-CM | POA: Insufficient documentation

## 2021-02-15 DIAGNOSIS — D849 Immunodeficiency, unspecified: Secondary | ICD-10-CM | POA: Insufficient documentation

## 2021-02-20 ENCOUNTER — Inpatient Hospital Stay (HOSPITAL_BASED_OUTPATIENT_CLINIC_OR_DEPARTMENT_OTHER)
Admission: EM | Admit: 2021-02-20 | Discharge: 2021-02-25 | DRG: 438 | Disposition: A | Discharge status: Home / Self Care | Payer: Medicare HMO | Attending: Internal Medicine | Admitting: Internal Medicine

## 2021-02-20 ENCOUNTER — Emergency Department (HOSPITAL_COMMUNITY): Payer: Medicare HMO

## 2021-02-20 ENCOUNTER — Emergency Department (HOSPITAL_COMMUNITY)
Admission: EM | Admit: 2021-02-20 | Discharge: 2021-02-20 | Discharge status: Against Medical Advice | Payer: Medicare HMO | Source: Home / Self Care | Attending: Emergency Medicine | Admitting: Emergency Medicine

## 2021-02-20 ENCOUNTER — Encounter (HOSPITAL_COMMUNITY): Payer: Self-pay

## 2021-02-20 ENCOUNTER — Other Ambulatory Visit: Payer: Self-pay

## 2021-02-20 DIAGNOSIS — Z681 Body mass index (BMI) 19 or less, adult: Secondary | ICD-10-CM

## 2021-02-20 DIAGNOSIS — R1031 Right lower quadrant pain: Secondary | ICD-10-CM | POA: Insufficient documentation

## 2021-02-20 DIAGNOSIS — Z20822 Contact with and (suspected) exposure to covid-19: Secondary | ICD-10-CM | POA: Diagnosis present

## 2021-02-20 DIAGNOSIS — K2900 Acute gastritis without bleeding: Secondary | ICD-10-CM | POA: Diagnosis present

## 2021-02-20 DIAGNOSIS — M25562 Pain in left knee: Secondary | ICD-10-CM | POA: Diagnosis present

## 2021-02-20 DIAGNOSIS — K863 Pseudocyst of pancreas: Secondary | ICD-10-CM | POA: Diagnosis present

## 2021-02-20 DIAGNOSIS — R110 Nausea: Secondary | ICD-10-CM | POA: Insufficient documentation

## 2021-02-20 DIAGNOSIS — E876 Hypokalemia: Secondary | ICD-10-CM | POA: Diagnosis not present

## 2021-02-20 DIAGNOSIS — D62 Acute posthemorrhagic anemia: Secondary | ICD-10-CM | POA: Diagnosis present

## 2021-02-20 DIAGNOSIS — Z833 Family history of diabetes mellitus: Secondary | ICD-10-CM

## 2021-02-20 DIAGNOSIS — Z809 Family history of malignant neoplasm, unspecified: Secondary | ICD-10-CM

## 2021-02-20 DIAGNOSIS — K317 Polyp of stomach and duodenum: Secondary | ICD-10-CM | POA: Diagnosis present

## 2021-02-20 DIAGNOSIS — R109 Unspecified abdominal pain: Secondary | ICD-10-CM | POA: Diagnosis present

## 2021-02-20 DIAGNOSIS — K921 Melena: Secondary | ICD-10-CM | POA: Diagnosis present

## 2021-02-20 DIAGNOSIS — I10 Essential (primary) hypertension: Secondary | ICD-10-CM | POA: Diagnosis present

## 2021-02-20 DIAGNOSIS — F17210 Nicotine dependence, cigarettes, uncomplicated: Secondary | ICD-10-CM | POA: Diagnosis present

## 2021-02-20 DIAGNOSIS — Z8249 Family history of ischemic heart disease and other diseases of the circulatory system: Secondary | ICD-10-CM

## 2021-02-20 DIAGNOSIS — E872 Acidosis: Secondary | ICD-10-CM | POA: Diagnosis present

## 2021-02-20 DIAGNOSIS — G600 Hereditary motor and sensory neuropathy: Secondary | ICD-10-CM | POA: Diagnosis not present

## 2021-02-20 DIAGNOSIS — R64 Cachexia: Secondary | ICD-10-CM | POA: Diagnosis present

## 2021-02-20 DIAGNOSIS — E43 Unspecified severe protein-calorie malnutrition: Secondary | ICD-10-CM | POA: Diagnosis present

## 2021-02-20 DIAGNOSIS — D72829 Elevated white blood cell count, unspecified: Secondary | ICD-10-CM | POA: Diagnosis present

## 2021-02-20 DIAGNOSIS — Z5321 Procedure and treatment not carried out due to patient leaving prior to being seen by health care provider: Secondary | ICD-10-CM | POA: Insufficient documentation

## 2021-02-20 DIAGNOSIS — M25561 Pain in right knee: Secondary | ICD-10-CM | POA: Diagnosis present

## 2021-02-20 DIAGNOSIS — G8929 Other chronic pain: Secondary | ICD-10-CM | POA: Diagnosis present

## 2021-02-20 DIAGNOSIS — R112 Nausea with vomiting, unspecified: Secondary | ICD-10-CM | POA: Diagnosis present

## 2021-02-20 DIAGNOSIS — D649 Anemia, unspecified: Secondary | ICD-10-CM | POA: Diagnosis present

## 2021-02-20 DIAGNOSIS — N179 Acute kidney failure, unspecified: Secondary | ICD-10-CM | POA: Diagnosis present

## 2021-02-20 DIAGNOSIS — E860 Dehydration: Secondary | ICD-10-CM | POA: Diagnosis present

## 2021-02-20 DIAGNOSIS — K859 Acute pancreatitis without necrosis or infection, unspecified: Principal | ICD-10-CM | POA: Diagnosis present

## 2021-02-20 DIAGNOSIS — E639 Nutritional deficiency, unspecified: Secondary | ICD-10-CM | POA: Insufficient documentation

## 2021-02-20 HISTORY — DX: Other chronic pain: G89.29

## 2021-02-20 HISTORY — DX: Essential (primary) hypertension: I10

## 2021-02-20 HISTORY — DX: Chronic kidney disease, unspecified: N18.9

## 2021-02-20 HISTORY — DX: Depression, unspecified: F32.A

## 2021-02-20 HISTORY — DX: Gastro-esophageal reflux disease without esophagitis: K21.9

## 2021-02-20 LAB — COMPREHENSIVE METABOLIC PANEL
ALT: 16 U/L (ref 0–44)
AST: 14 U/L — ABNORMAL LOW (ref 15–41)
Albumin: 2.1 g/dL — ABNORMAL LOW (ref 3.5–5.0)
Alkaline Phosphatase: 168 U/L — ABNORMAL HIGH (ref 38–126)
Anion gap: 22 — ABNORMAL HIGH (ref 5–15)
BUN: 19 mg/dL (ref 8–23)
CO2: 11 mmol/L — ABNORMAL LOW (ref 22–32)
Calcium: 8.2 mg/dL — ABNORMAL LOW (ref 8.9–10.3)
Chloride: 106 mmol/L (ref 98–111)
Creatinine, Ser: 1.52 mg/dL — ABNORMAL HIGH (ref 0.61–1.24)
GFR, Estimated: 49 mL/min — ABNORMAL LOW (ref >60)
Glucose, Bld: 97 mg/dL (ref 70–99)
Potassium: 3.4 mmol/L — ABNORMAL LOW (ref 3.5–5.1)
Sodium: 139 mmol/L (ref 135–145)
Total Bilirubin: 2.6 mg/dL — ABNORMAL HIGH (ref 0.3–1.2)
Total Protein: 5.7 g/dL — ABNORMAL LOW (ref 6.5–8.1)

## 2021-02-20 LAB — RESP PANEL BY RT-PCR (FLU A&B, COVID) ARPGX2
Influenza A by PCR: NEGATIVE
Influenza B by PCR: NEGATIVE
SARS Coronavirus 2 by RT PCR: NEGATIVE

## 2021-02-20 LAB — CBC
HCT: 29 % — ABNORMAL LOW (ref 39.0–52.0)
Hemoglobin: 9.1 g/dL — ABNORMAL LOW (ref 13.0–17.0)
MCH: 31.1 pg (ref 26.0–34.0)
MCHC: 31.4 g/dL (ref 30.0–36.0)
MCV: 99 fL (ref 80.0–100.0)
Platelets: 729 10*3/uL — ABNORMAL HIGH (ref 150–400)
RBC: 2.93 MIL/uL — ABNORMAL LOW (ref 4.22–5.81)
RDW: 14.2 % (ref 11.5–15.5)
WBC: 14.4 10*3/uL — ABNORMAL HIGH (ref 4.0–10.5)
nRBC: 0 % (ref 0.0–0.2)

## 2021-02-20 LAB — TROPONIN I (HIGH SENSITIVITY): Troponin I (High Sensitivity): 20 ng/L — ABNORMAL HIGH (ref <18)

## 2021-02-20 LAB — LIPASE, BLOOD: Lipase: 712 U/L — ABNORMAL HIGH (ref 11–51)

## 2021-02-20 MED ORDER — ONDANSETRON HCL 4 MG/2ML IJ SOLN
4.0000 mg | Freq: Once | INTRAMUSCULAR | Status: AC
  Administered 2021-02-20: 4 mg via INTRAVENOUS
  Filled 2021-02-20: qty 2

## 2021-02-20 MED ORDER — IOHEXOL 300 MG/ML  SOLN
100.0000 mL | Freq: Once | INTRAMUSCULAR | Status: AC | PRN
  Administered 2021-02-20: 100 mL via INTRAVENOUS

## 2021-02-20 MED ORDER — HYDROMORPHONE HCL 1 MG/ML IJ SOLN
1.0000 mg | Freq: Once | INTRAMUSCULAR | Status: AC
Start: 2021-02-20 — End: 2021-02-20
  Administered 2021-02-20: 1 mg via INTRAVENOUS
  Filled 2021-02-20: qty 1

## 2021-02-20 MED ORDER — SODIUM CHLORIDE 0.9 % IV BOLUS
1000.0000 mL | Freq: Once | INTRAVENOUS | Status: AC
  Administered 2021-02-20: 1000 mL via INTRAVENOUS

## 2021-02-20 MED ORDER — MORPHINE SULFATE (PF) 4 MG/ML IV SOLN
4.0000 mg | Freq: Once | INTRAVENOUS | Status: AC
  Administered 2021-02-20: 4 mg via INTRAVENOUS
  Filled 2021-02-20: qty 1

## 2021-02-20 MED ORDER — LABETALOL HCL 5 MG/ML IV SOLN
10.0000 mg | Freq: Once | INTRAVENOUS | Status: AC
  Administered 2021-02-20: 10 mg via INTRAVENOUS
  Filled 2021-02-20: qty 4

## 2021-02-20 NOTE — ED Notes (Signed)
Blood pressure reported to provider, orders given.

## 2021-02-20 NOTE — ED Triage Notes (Signed)
Emergency Medicine Provider Triage Evaluation Note  Manuel Castro , a 70 y.o. male  was evaluated in triage.  Pt complains of chest pain, epigastric pain.  Has history of pancreatitis, feels similar.  He has no longer drinking alcohol.  He is on chronic pain medication for Charcot-Marie-Tooth syndrome.  He has had nausea, no recent vomiting.  He is unable to tolerate p.o. intake.  No syncope. Pain does not radiate into back  Still have gallbladder  Review of Systems  Positive: Chest pain, abdominal pain, nausea Negative: Back pain, shortness of breath, bloody stools  Physical Exam  BP (!) 178/72   Pulse 99   Temp 97.8 F (36.6 C) (Oral)   Resp (!) 22   SpO2 100%  Gen:   Awake, no distress   HEENT:  Atraumatic  Resp:  Normal effort  Cardiac:  Normal rate  Abd:   Nondistended, diffuse abdominal tenderness MSK:   Moves extremities without difficulty  Neuro:  Speech clear   Medical Decision Making  Medically screening exam initiated at 1:01 PM.  Appropriate orders placed.  Valeria Krisko was informed that the remainder of the evaluation will be completed by another provider, this initial triage assessment does not replace that evaluation, and the importance of remaining in the ED until their evaluation is complete.  Clinical Impression  Epigastric pain, CP   Nirvan Laban A, PA-C 02/20/21 1305

## 2021-02-20 NOTE — ED Triage Notes (Signed)
C/o epigastric pain x2 weeks. C/o nausea, gagging & diarrhea; denies vomiting. Denies fever. Hx of pancreatitis. CT scan at [facility] showed worsening of pancreatitis.

## 2021-02-20 NOTE — ED Notes (Signed)
Report given to [facility], RN at [facility]

## 2021-02-20 NOTE — Progress Notes (Signed)
Manuel Castro is a 70 y.o. male patient admitted. Awake, alert - oriented  X 4 - Pain 8/10 , right lower abdominal area.  VSS - Blood pressure (!) 184/96, pulse (!) 110, temperature 98.8 F (37.1 C), temperature source Oral, resp. rate 16, height 5\' 8"  (1.727 m), weight 56 kg, SpO2 100 %.    IV in place, occlusive dsg intact without redness.  Orientation to room, and floor completed.  Admission INP armband ID verified with patient/family, and in place.   SR up x 2, fall assessment complete, with patient and family able to verbalize understanding of risk associated with falls, and verbalized understanding to call nsg before up out of bed.  Call light within reach, patient able to voice, and demonstrate understanding. No evidence of skin break down noted on exam.  Admission nurse notified of admission.     Will cont to eval and treat per MD orders.  , RN 02/20/2021 11:35 PM

## 2021-02-20 NOTE — ED Notes (Signed)
Pt left due to wait time  

## 2021-02-20 NOTE — ED Provider Notes (Addendum)
[facility] EMERGENCY DEPT Provider Note   CSN: [id number] Arrival date & time: 02/20/21  2044     History Chief Complaint  Patient presents with  . Abdominal Pain    Manuel Castro is a 70 y.o. male.  Pt presents to the ED today with abdominal pain.  The pt initially presented to [facility], but waited for over 8 hrs and then came here.  The pt had labs and a CT scan while waiting.  Pt was initially diagnosed with pancreatitis on 3/29.  He has been unable to keep anything down and his pain is worse.  [facility] done [facility] showed worsening of his pancreatitis with pseudocyst formation.        Past Medical History:  Diagnosis Date  . CMT (Charcot-Marie-Tooth disease)     Patient Active Problem List   Diagnosis Date Noted  . Acute pancreatitis 02/20/2021  . Chronic pain of both knees 08/22/2017  . Pain of both hip joints 08/22/2017  . ANEMIA-NOS 07/17/2007  . DEPRESSION 07/17/2007  . GERD 07/17/2007  . BREAST BIOPSY, HX OF 07/17/2007    Past Surgical History:  Procedure Laterality Date  . ADENOIDECTOMY    . FOOT SURGERY Bilateral   . HERNIA REPAIR    . NECK SURGERY    . TONSILLECTOMY         Family History  Problem Relation Age of Onset  . Cancer Mother   . Heart attack Mother   . Diabetes Father   . Heart attack Father     Social History   Tobacco Use  . Smoking status: Current Every Day Smoker    Packs/day: 0.35    Types: Cigarettes  . Smokeless tobacco: Never Used  Vaping Use  . Vaping Use: Never used  Substance Use Topics  . Alcohol use: Yes  . Drug use: Not Currently    Home Medications Prior to Admission medications   Medication Sig Start Date End Date Taking? Authorizing Provider  amLODipine (NORVASC) 5 MG tablet Take 5 mg by mouth daily. 01/20/21   [provider]  dorzolamide-timolol (COSOPT) 22.3-6.8 MG/ML ophthalmic solution Place 1 drop into both eyes 2 (two) times daily. 01/16/21   [provider]  gabapentin  (NEURONTIN) 600 MG tablet Take 600 mg by mouth 3 (three) times daily. 11/18/20   [provider]  morphine (MSIR) 15 MG tablet Take 0.5 tablets (7.5 mg total) by mouth every 4 (four) hours as needed for severe pain. 02/07/21   Melene Plan, DO  ondansetron (ZOFRAN ODT) 4 MG disintegrating tablet 4mg  ODT q4 hours prn nausea/vomit 02/07/21   02/09/21, DO  traMADol (ULTRAM) 50 MG tablet Take 50 mg by mouth 3 (three) times daily as needed for pain. 01/17/21   [provider]  traZODone (DESYREL) 100 MG tablet Take 100 mg by mouth at bedtime. 01/16/21   [provider]    Allergies    Patient has no known allergies.  Review of Systems   Review of Systems  Gastrointestinal: Positive for abdominal pain, nausea and vomiting.  All other systems reviewed and are negative.   Physical Exam Updated Vital Signs BP (!) 201/101   Pulse (!) 117   Temp 99.1 F (37.3 C)   Resp 20   Ht 5\' 8"  (1.727 m)   Wt 58.2 kg   SpO2 100%   BMI 19.49 kg/m   Physical Exam Vitals and nursing note reviewed.  Constitutional:      Appearance: He is well-developed.  HENT:     Head: Normocephalic and atraumatic.     Mouth/Throat:     Mouth: Mucous membranes are dry.  Eyes:     Extraocular Movements: Extraocular movements intact.     Pupils: Pupils are equal, round, and reactive to light.  Cardiovascular:     Rate and Rhythm: Regular rhythm. Tachycardia present.  Pulmonary:     Effort: Pulmonary effort is normal.     Breath sounds: Normal breath sounds.  Abdominal:     General: Abdomen is flat. Bowel sounds are normal.     Palpations: Abdomen is soft.     Tenderness: There is abdominal tenderness in the epigastric area.  Musculoskeletal:     Comments: Bilateral leg braces  Skin:    General: Skin is warm.     Capillary Refill: Capillary refill takes less than 2 seconds.  Neurological:     General: No focal deficit present.     Mental Status: He is alert and oriented to person, place,  and time.  Psychiatric:        Mood and Affect: Mood normal.        Behavior: Behavior normal.     ED Results / Procedures / Treatments   Labs (all labs ordered are listed, but only abnormal results are displayed) Labs Reviewed  RESP PANEL BY RT-PCR (FLU A&B, COVID) [id number]    EKG None  Radiology DG Chest 2 View  Result Date: 02/20/2021 CLINICAL DATA:  Chest and abdominal pain for the past 2 weeks. EXAM: CHEST - 2 VIEW COMPARISON:  None. FINDINGS: The heart size and mediastinal contours are within normal limits. Normal pulmonary vascularity. Hyperinflated lungs with coarsened interstitial markings. No focal consolidation, pleural effusion, or pneumothorax. Nipple shadow noted at the right lung base. No acute osseous abnormality. IMPRESSION: 1. No acute cardiopulmonary disease. 2. COPD. Electronically Signed   By: Obie Dredge M.D.   On: 02/20/2021 14:47   CT ABDOMEN PELVIS W CONTRAST  Result Date: 02/20/2021 CLINICAL DATA:  Epigastric pain EXAM: CT ABDOMEN AND PELVIS WITH CONTRAST TECHNIQUE: Multidetector CT imaging of the abdomen and pelvis was performed using the standard protocol following bolus administration of intravenous contrast. CONTRAST:  OMNIPAQUE IOHEXOL 300 MG/ML  SOLN COMPARISON:  02/07/2021 FINDINGS: Lower chest: No acute pleural or parenchymal lung disease. Hepatobiliary: No focal liver abnormality is seen. No gallstones, gallbladder wall thickening, or biliary dilatation. Pancreas: Inflammatory changes are again seen surrounding the pancreas, consistent with acute pancreatitis. The parenchyma enhances normally. Parenchymal calcifications again noted within the uncinate process. In the interim since prior study, multilocular fluid collections have developed compatible with pseudocysts. The largest is seen extending from the pancreatic head into the porta hepatis measuring 2.6 x 3.1 cm reference image 19/3. Multilocular collections along the ventral aspect of the  pancreatic body are also noted, measuring up to 1.9 x 1.9 cm reference image 31/3. Spleen: Normal in size without focal abnormality. Adrenals/Urinary Tract: Stable chronic severe left renal atrophy, with continued dilatation of the left renal pelvis and proximal left ureter. Compensatory hypertrophy of the right kidney, with numerous right renal cysts unchanged. No evidence of urinary tract calculi. The bladder and adrenals are unremarkable. Stomach/Bowel: There is no evidence of bowel obstruction. Scattered gas fluid levels within several loops of jejunum within the left upper quadrant could reflect mild ileus. There is wall thickening of the ascending and proximal transverse colon, likely due to the adjacent inflammatory changes due to acute pancreatitis. The structure within the proximal colonic  lumen on prior study has passed in the interim, consistent with stool. No evidence of persistent mass. The appendix is normal. Vascular/Lymphatic: Aortic atherosclerosis. No enlarged abdominal or pelvic lymph nodes. Reproductive: Prostate is unremarkable. Right-sided hydrocele again partially visualized. Other: Trace free fluid within the right paracolic gutter. No free intraperitoneal gas. Small fat containing right inguinal hernia. No bowel herniation. Musculoskeletal: No acute or destructive bony lesions. Reconstructed images demonstrate stable multilevel lumbar spondylosis greatest at L2-3, L4-5, and L5-S1. IMPRESSION: 1. Progression of acute pancreatitis, with interval development of multilocular fluid collections consistent with pseudocysts. 2. Mild wall thickening of decompressed proximal colon, likely due to the adjacent inflammatory changes due to pancreatitis. 3. Scattered gas fluid levels within loops of jejunum in the left upper quadrant which may reflect mild ileus. No bowel obstruction. 4. Other stable findings throughout the abdomen and pelvis as above. Electronically Signed   By: Sharlet Salina M.D.   On:  02/20/2021 18:14    Procedures Procedures   Medications Ordered in ED Medications  sodium chloride 0.9 % bolus 1,000 mL (0 mLs Intravenous Stopped 02/20/21 2214)  morphine 4 MG/ML injection 4 mg (4 mg Intravenous Given 02/20/21 2112)  ondansetron (ZOFRAN) injection 4 mg (4 mg Intravenous Given 02/20/21 2112)  HYDROmorphone (DILAUDID) injection 1 mg (1 mg Intravenous Given 02/20/21 2144)  sodium chloride 0.9 % bolus 1,000 mL (1,000 mLs Intravenous New Bag/Given 02/20/21 2225)  labetalol (NORMODYNE) injection 10 mg (10 mg Intravenous Given 02/20/21 2226)    ED Course  I have reviewed the triage vital signs and the nursing notes.  Pertinent labs & imaging results that were available during my care of the patient were reviewed by me and considered in my medical decision making (see chart for details).    MDM Rules/Calculators/A&P                         Covid neg.  Pt had his work up today at [facility].  Results reviewed.  He is started on IVFs and is given pain meds.  The pt is d/w Dr. Lewis Moccasin ([facility]) for admission.  Pt is also quite hypertensive.  He has been unable to tolerate his bp meds.  Pt given labetalol.  Final Clinical Impression(s) / ED Diagnoses Final diagnoses:  Pseudocyst of pancreas  Dehydration  Non-intractable vomiting with nausea, unspecified vomiting type  Acute pancreatitis, unspecified complication status, unspecified pancreatitis type  Hypertension, unspecified type    Rx / DC Orders ED Discharge Orders    None       Jacalyn Lefevre, MD 02/20/21 2137    Jacalyn Lefevre, MD 02/20/21 2218    Jacalyn Lefevre, MD 02/20/21 2234

## 2021-02-20 NOTE — ED Triage Notes (Signed)
Pt reports lower abd pain x1 week. Pt RLQ worse today & nausea only today.Pt reports h/o pancreatitis.

## 2021-02-21 ENCOUNTER — Encounter (HOSPITAL_COMMUNITY): Payer: Self-pay | Admitting: Family Medicine

## 2021-02-21 ENCOUNTER — Inpatient Hospital Stay (HOSPITAL_COMMUNITY): Payer: Medicare HMO

## 2021-02-21 DIAGNOSIS — R112 Nausea with vomiting, unspecified: Secondary | ICD-10-CM

## 2021-02-21 DIAGNOSIS — N179 Acute kidney failure, unspecified: Secondary | ICD-10-CM

## 2021-02-21 DIAGNOSIS — E876 Hypokalemia: Secondary | ICD-10-CM

## 2021-02-21 DIAGNOSIS — E43 Unspecified severe protein-calorie malnutrition: Secondary | ICD-10-CM | POA: Insufficient documentation

## 2021-02-21 DIAGNOSIS — I10 Essential (primary) hypertension: Secondary | ICD-10-CM

## 2021-02-21 DIAGNOSIS — D649 Anemia, unspecified: Secondary | ICD-10-CM

## 2021-02-21 DIAGNOSIS — K859 Acute pancreatitis without necrosis or infection, unspecified: Principal | ICD-10-CM

## 2021-02-21 DIAGNOSIS — D72829 Elevated white blood cell count, unspecified: Secondary | ICD-10-CM

## 2021-02-21 LAB — IRON AND TIBC
Iron: 25 ug/dL — ABNORMAL LOW (ref 45–182)
Saturation Ratios: 21 % (ref 17.9–39.5)
TIBC: 118 ug/dL — ABNORMAL LOW (ref 250–450)
UIBC: 93 ug/dL

## 2021-02-21 LAB — CBC
HCT: 25.1 % — ABNORMAL LOW (ref 39.0–52.0)
Hemoglobin: 8.2 g/dL — ABNORMAL LOW (ref 13.0–17.0)
MCH: 31.5 pg (ref 26.0–34.0)
MCHC: 32.7 g/dL (ref 30.0–36.0)
MCV: 96.5 fL (ref 80.0–100.0)
Platelets: 597 10*3/uL — ABNORMAL HIGH (ref 150–400)
RBC: 2.6 MIL/uL — ABNORMAL LOW (ref 4.22–5.81)
RDW: 14.3 % (ref 11.5–15.5)
WBC: 10.5 10*3/uL (ref 4.0–10.5)
nRBC: 0 % (ref 0.0–0.2)

## 2021-02-21 LAB — FOLATE: Folate: 23.1 ng/mL (ref >5.9)

## 2021-02-21 LAB — COMPREHENSIVE METABOLIC PANEL
ALT: 14 U/L (ref 0–44)
AST: 14 U/L — ABNORMAL LOW (ref 15–41)
Albumin: 2 g/dL — ABNORMAL LOW (ref 3.5–5.0)
Alkaline Phosphatase: 148 U/L — ABNORMAL HIGH (ref 38–126)
Anion gap: 13 (ref 5–15)
BUN: 14 mg/dL (ref 8–23)
CO2: 15 mmol/L — ABNORMAL LOW (ref 22–32)
Calcium: 8 mg/dL — ABNORMAL LOW (ref 8.9–10.3)
Chloride: 112 mmol/L — ABNORMAL HIGH (ref 98–111)
Creatinine, Ser: 1.26 mg/dL — ABNORMAL HIGH (ref 0.61–1.24)
GFR, Estimated: >60 mL/min (ref >60)
Glucose, Bld: 69 mg/dL — ABNORMAL LOW (ref 70–99)
Potassium: 3.4 mmol/L — ABNORMAL LOW (ref 3.5–5.1)
Sodium: 140 mmol/L (ref 135–145)
Total Bilirubin: 1.3 mg/dL — ABNORMAL HIGH (ref 0.3–1.2)
Total Protein: 5.4 g/dL — ABNORMAL LOW (ref 6.5–8.1)

## 2021-02-21 LAB — FERRITIN: Ferritin: 435 ng/mL — ABNORMAL HIGH (ref 24–336)

## 2021-02-21 LAB — RETICULOCYTES
Immature Retic Fract: 13 % (ref 2.3–15.9)
RBC.: 2.61 MIL/uL — ABNORMAL LOW (ref 4.22–5.81)
Retic Count, Absolute: 42 10*3/uL (ref 19.0–186.0)
Retic Ct Pct: 1.6 % (ref 0.4–3.1)

## 2021-02-21 LAB — PHOSPHORUS: Phosphorus: 2.2 mg/dL — ABNORMAL LOW (ref 2.5–4.6)

## 2021-02-21 LAB — GAMMA GT: GGT: 61 U/L — ABNORMAL HIGH (ref 7–50)

## 2021-02-21 LAB — PROTIME-INR
INR: 1.3 — ABNORMAL HIGH (ref 0.8–1.2)
Prothrombin Time: 15.4 seconds — ABNORMAL HIGH (ref 11.4–15.2)

## 2021-02-21 LAB — ETHANOL: Alcohol, Ethyl (B): <10 mg/dL (ref <10)

## 2021-02-21 LAB — HIV ANTIBODY (ROUTINE TESTING W REFLEX): HIV Screen 4th Generation wRfx: NONREACTIVE

## 2021-02-21 LAB — MAGNESIUM: Magnesium: 1.6 mg/dL — ABNORMAL LOW (ref 1.7–2.4)

## 2021-02-21 LAB — BILIRUBIN, DIRECT: Bilirubin, Direct: 0.1 mg/dL (ref 0.0–0.2)

## 2021-02-21 LAB — TRIGLYCERIDES: Triglycerides: 168 mg/dL — ABNORMAL HIGH (ref <150)

## 2021-02-21 MED ORDER — GADOBUTROL 1 MMOL/ML IV SOLN
5.6000 mL | Freq: Once | INTRAVENOUS | Status: AC | PRN
  Administered 2021-02-21: 5.6 mL via INTRAVENOUS

## 2021-02-21 MED ORDER — LACTATED RINGERS IV SOLN: INTRAVENOUS | Status: DC

## 2021-02-21 MED ORDER — ADULT MULTIVITAMIN W/MINERALS CH
1.0000 | ORAL_TABLET | Freq: Every day | ORAL | Status: DC
  Administered 2021-02-21 – 2021-02-22 (×2): 1 via ORAL
  Filled 2021-02-21 (×2): qty 1

## 2021-02-21 MED ORDER — NALOXONE HCL 0.4 MG/ML IJ SOLN: 0.4000 mg | INTRAMUSCULAR | Status: DC | PRN

## 2021-02-21 MED ORDER — POTASSIUM CHLORIDE 10 MEQ/100ML IV SOLN
10.0000 meq | INTRAVENOUS | Status: AC
  Administered 2021-02-21 (×3): 10 meq via INTRAVENOUS
  Filled 2021-02-21: qty 100

## 2021-02-21 MED ORDER — HYDROMORPHONE HCL 1 MG/ML IJ SOLN
0.5000 mg | INTRAMUSCULAR | Status: DC | PRN
  Administered 2021-02-21: 0.5 mg via INTRAVENOUS
  Filled 2021-02-21: qty 1

## 2021-02-21 MED ORDER — HYDROMORPHONE HCL 1 MG/ML IJ SOLN
1.0000 mg | INTRAMUSCULAR | Status: DC | PRN
  Administered 2021-02-21 – 2021-02-24 (×11): 1 mg via INTRAVENOUS
  Filled 2021-02-21 (×11): qty 1

## 2021-02-21 MED ORDER — THIAMINE HCL 100 MG PO TABS
250.0000 mg | ORAL_TABLET | Freq: Every day | ORAL | Status: DC
  Administered 2021-02-21 – 2021-02-22 (×2): 250 mg via ORAL
  Filled 2021-02-21 (×2): qty 3

## 2021-02-21 MED ORDER — ONDANSETRON HCL 4 MG/2ML IJ SOLN
4.0000 mg | Freq: Four times a day (QID) | INTRAMUSCULAR | Status: DC | PRN
  Administered 2021-02-21: 4 mg via INTRAVENOUS
  Filled 2021-02-21: qty 2

## 2021-02-21 NOTE — H&P (Signed)
History and Physical    PLEASE NOTE THAT DRAGON DICTATION SOFTWARE WAS USED IN THE CONSTRUCTION OF THIS NOTE.   Manuel ReapHarry Moise ZOX:[id number] DOB: 08/29/1951 DOA: 02/20/2021  PCP: Margot Ableskwubunka-Anyim, [place]un[place], MD Patient [facility] from: [facility]  I have personally briefly reviewed patient's old medical [facility]  Chief Complaint: Abdominal pain  HPI: Manuel Castro is a 70 y.o. male with medical history significant for GERD: Hypertension, chronic tobacco abuse, who is [facility] on 02/20/2021 by way of transfer [facility] with acute pancreatitis after [facility] complaining of abdominal pain.   The patient reports approximately 2 weeks of progressive sharp abdominal discomfort.  He reports that this abdominal pain is in the bilateral upper quadrants, and without any radiation from this location.  Reports that the pain has been nearly constant over that timeframe, and worsens with palpation over the abdomen as well as when attempting to eat or drink, with exacerbation abdominal discomfort within several minutes of these prandial attempts.  Denies any preceding trauma.  He notes associated intermittent nausea resulting in approximately 10 episodes of nonbloody, nonbilious emesis over the last week.  Denies any associated diarrhea.  Reports continued flatus production.  Denies any associated subjective fever, chills, rigors, or generalized myalgias.  No recent melena or hematochezia.  Denies any recent dysuria, gross hematuria, or change in urinary urgency/frequency.  He nearly daily alcohol consumption which he reports consuming approximately 1-2 alcoholic beverages per day.  Denies any recent abdominal surgery or ERCP.  No recent known COVID-19 exposures.  Denies any associated chest pain, diaphoresis, palpitations, dizziness, presyncope, or syncope.  The originally presented to 02/07/2021 with complaints of abdominal pain pancreatitis.  He was  discharged to home for conservative management.  However, in spite of the patient's report attempts to maintain n.p.o. status, he has continued to experience progression of his abdominal pain as well as persistence of his intermittent nausea/vomiting, as further outlined above, prompting him to present to [facility] on 02/20/2021 for further evaluation thereof.   [facility] Course:  Vital signs in the ED were notable for the following: Tetramex 99.1, initial heart rate 117, which decreased to 93 following interval IV fluids, as further described below; blood pressure 181/95; respiratory rate 16-20, oxygen saturation 100% on room air.  Labs were notable for the following: CMP was notable for the following: Sodium 139, potassium 3.4, bicarbonate 11, anion gap 22, BUN 19, creatinine 1.50 relative to most recent prior value of 0.90 on 02/07/2021, glucose 97, alkaline phosphatase 168, AST 14, ALT 16, total bilirubin 2.6.  Lipase 712.  CBC notable for white blood cell count of 14,000, hemoglobin 9.1 relative to 10.5 on 02/07/2021.  Urinalysis showed no white blood cells, nitrate negative, leukocyte Estrace negative, and elevated specific gravity of 1.0 reported addition to 5 ketones.  Nasopharyngeal COVID-19 PCR was performed at [facility] on 02/20/2021 Oluwademilade Mckiver be negative.  2 view chest x-ray showed no evidence of acute cardiopulmonary process.  CT abdomen/pelvis, relative to CT abdomen/pelvis on 02/07/2021, showed interval progression of acute pancreatitis with multi loculated fluid collections suggestive of pseudocyst.  CT also showed mild wall thickening of the proximal colon likely due to adjacent inflammatory changes associated with the pancreas, in the absence of any bowel obstruction.  CT abdomen/pelvis also showed no evidence of gallbladder wall thickening, biliary dilation, or gallstones.  No evidence to suggest bowel perforation or abscess.  While in  [facility] ED, the following were  administered: Dilaudid 1 mg IV x1, morphine 4 mg IV x1, labetalol 10 mg IV x1, Zofran 4 mg IV x1, and a 2 L normal saline bolus.  Subsequently with the patient was transferred to [facility] for further evaluation management of presenting acute pancreatitis.     Review of Systems: As per HPI otherwise 10 point review of systems negative.   Past Medical History:  Diagnosis Date  . Chronic pain    b/l knees  . CMT (Charcot-Marie-Tooth disease)   . Depression   . GERD (gastroesophageal reflux disease)   . HTN (hypertension)     Past Surgical History:  Procedure Laterality Date  . ADENOIDECTOMY    . FOOT SURGERY Bilateral   . HERNIA REPAIR    . NECK SURGERY    . TONSILLECTOMY      Social History:  reports that he has been smoking cigarettes. He has been smoking about 0.35 packs per day. He has never used smokeless tobacco. He reports current alcohol use. He reports previous drug use.   No Known Allergies  Family History  Problem Relation Age of Onset  . Cancer Mother   . Heart attack Mother   . Diabetes Father   . Heart attack Father     Family history reviewed and not pertinent    Prior to Admission medications   Medication Sig Start Date End Date Taking? Authorizing Provider  amLODipine (NORVASC) 5 MG tablet Take 5 mg by mouth daily. 01/20/21   [provider]  dorzolamide-timolol (COSOPT) 22.3-6.8 MG/ML ophthalmic solution Place 1 drop into both eyes 2 (two) times daily. 01/16/21   [provider]  gabapentin (NEURONTIN) 600 MG tablet Take 600 mg by mouth 3 (three) times daily. 11/18/20   [provider]  morphine (MSIR) 15 MG tablet Take 0.5 tablets (7.5 mg total) by mouth every 4 (four) hours as needed for severe pain. 02/07/21   Melene Plan, DO  ondansetron (ZOFRAN ODT) 4 MG disintegrating tablet 4mg  ODT q4 hours prn nausea/vomit 02/07/21   02/09/21, DO  traMADol (ULTRAM) 50 MG tablet Take 50 mg by mouth 3 (three) times daily as needed for pain.  01/17/21   [provider]  traZODone (DESYREL) 100 MG tablet Take 100 mg by mouth at bedtime. 01/16/21   [provider]     Objective    Physical Exam: Vitals:   02/20/21 2200 02/20/21 2215 02/20/21 2245 02/20/21 2329  BP: (!) 202/96 (!) 201/101 (!) 181/95 (!) 184/96  Pulse:   93 (!) 110  Resp:   18 16  Temp:   99.5 F (37.5 C) 98.8 F (37.1 C)  TempSrc:    Oral  SpO2:   100% 100%  Weight:    56 kg  Height:        General: appears to be stated age; alert, oriented Skin: warm, dry, no rash Head:  AT/[place] Mouth:  Oral mucosa membranes appear dry, normal dentition Neck: supple; trachea midline Heart:  RRR; did not appreciate any M/R/G Lungs: CTAB, did not appreciate any wheezes, rales, or rhonchi Abdomen: + BS; soft, ND; tenderness to palpation over the bilateral upper abdominal quadrants in the absence of any associated guarding, rigidity, or rebound tenderness. Vascular: 2+ pedal pulses b/l; 2+ radial pulses b/l Extremities: no peripheral edema, no muscle wasting Neuro: strength and sensation intact in upper and lower extremities b/l   Labs on Admission: I have personally reviewed following labs and imaging studies  CBC: Recent Labs  Lab 02/20/21 1344  WBC 14.4*  HGB 9.1*  HCT 29.0*  MCV 99.0  PLT 729*   Basic Metabolic Panel: Recent Labs  Lab 02/20/21 1344  NA 139  K 3.4*  CL 106  CO2 11*  GLUCOSE 97  BUN 19  CREATININE 1.52*  CALCIUM 8.2*   GFR: Estimated Creatinine Clearance: 36.3 mL/min (A) (by C-G formula based on SCr of 1.52 mg/dL (H)). Liver Function Tests: Recent Labs  Lab 02/20/21 1344  AST 14*  ALT 16  ALKPHOS 168*  BILITOT 2.6*  PROT 5.7*  ALBUMIN 2.1*   Recent Labs  Lab 02/20/21 1344  LIPASE 712*   No results for input(s): AMMONIA in the last 168 hours. Coagulation Profile: No results for input(s): INR, PROTIME in the last 168 hours. Cardiac Enzymes: No results for input(s): CKTOTAL, CKMB, CKMBINDEX, TROPONINI  in the last 168 hours. BNP (last 3 results) No results for input(s): PROBNP in the last 8760 hours. HbA1C: No results for input(s): HGBA1C in the last 72 hours. CBG: No results for input(s): GLUCAP in the last 168 hours. Lipid Profile: No results for input(s): CHOL, HDL, LDLCALC, TRIG, CHOLHDL, LDLDIRECT in the last 72 hours. Thyroid Function Tests: No results for input(s): TSH, T4TOTAL, FREET4, T3FREE, THYROIDAB in the last 72 hours. Anemia Panel: No results for input(s): VITAMINB12, FOLATE, FERRITIN, TIBC, IRON, RETICCTPCT in the last 72 hours. Urine analysis:    Component Value Date/Time   COLORURINE YELLOW 02/07/2021 1226   APPEARANCEUR CLEAR 02/07/2021 1226   LABSPEC 1.044 (H) 02/07/2021 1226   PHURINE 6.0 02/07/2021 1226   GLUCOSEU NEGATIVE 02/07/2021 1226   HGBUR NEGATIVE 02/07/2021 1226   BILIRUBINUR NEGATIVE 02/07/2021 1226   KETONESUR 5 (A) 02/07/2021 1226   PROTEINUR 100 (A) 02/07/2021 1226   NITRITE NEGATIVE 02/07/2021 1226   LEUKOCYTESUR NEGATIVE 02/07/2021 1226    Radiological Exams on Admission: DG Chest 2 View  Result Date: 02/20/2021 CLINICAL DATA:  Chest and abdominal pain for the past 2 weeks. EXAM: CHEST - 2 VIEW COMPARISON:  None. FINDINGS: The heart size and mediastinal contours are within normal limits. Normal pulmonary vascularity. Hyperinflated lungs with coarsened interstitial markings. No focal consolidation, pleural effusion, or pneumothorax. Nipple shadow noted at the right lung base. No acute osseous abnormality. IMPRESSION: 1. No acute cardiopulmonary disease. 2. COPD. Electronically Signed   By: Obie Dredge M.D.   On: 02/20/2021 14:47   CT ABDOMEN PELVIS W CONTRAST  Result Date: 02/20/2021 CLINICAL DATA:  Epigastric pain EXAM: CT ABDOMEN AND PELVIS WITH CONTRAST TECHNIQUE: Multidetector CT imaging of the abdomen and pelvis was performed using the standard protocol following bolus administration of intravenous contrast. CONTRAST:  OMNIPAQUE  IOHEXOL 300 MG/ML  SOLN COMPARISON:  02/07/2021 FINDINGS: Lower chest: No acute pleural or parenchymal lung disease. Hepatobiliary: No focal liver abnormality is seen. No gallstones, gallbladder wall thickening, or biliary dilatation. Pancreas: Inflammatory changes are again seen surrounding the pancreas, consistent with acute pancreatitis. The parenchyma enhances normally. Parenchymal calcifications again noted within the uncinate process. In the interim since prior study, multilocular fluid collections have developed compatible with pseudocysts. The largest is seen extending from the pancreatic head into the porta hepatis measuring 2.6 x 3.1 cm reference image 19/3. Multilocular collections along the ventral aspect of the pancreatic body are also noted, measuring up to 1.9 x 1.9 cm reference image 31/3. Spleen: Normal in size without focal abnormality. Adrenals/Urinary Tract: Stable chronic severe left renal atrophy, with continued dilatation of the  left renal pelvis and proximal left ureter. Compensatory hypertrophy of the right kidney, with numerous right renal cysts unchanged. No evidence of urinary tract calculi. The bladder and adrenals are unremarkable. Stomach/Bowel: There is no evidence of bowel obstruction. Scattered gas fluid levels within several loops of jejunum within the left upper quadrant could reflect mild ileus. There is wall thickening of the ascending and proximal transverse colon, likely due to the adjacent inflammatory changes due to acute pancreatitis. The structure within the proximal colonic lumen on prior study has passed in the interim, consistent with stool. No evidence of persistent mass. The appendix is normal. Vascular/Lymphatic: Aortic atherosclerosis. No enlarged abdominal or pelvic lymph nodes. Reproductive: Prostate is unremarkable. Right-sided hydrocele again partially visualized. Other: Trace free fluid within the right paracolic gutter. No free intraperitoneal gas. Small fat  containing right inguinal hernia. No bowel herniation. Musculoskeletal: No acute or destructive bony lesions. Reconstructed images demonstrate stable multilevel lumbar spondylosis greatest at L2-3, L4-5, and L5-S1. IMPRESSION: 1. Progression of acute pancreatitis, with interval development of multilocular fluid collections consistent with pseudocysts. 2. Mild wall thickening of decompressed proximal colon, likely due to the adjacent inflammatory changes due to pancreatitis. 3. Scattered gas fluid levels within loops of jejunum in the left upper quadrant which may reflect mild ileus. No bowel obstruction. 4. Other stable findings throughout the abdomen and pelvis as above. Electronically Signed   By: Sharlet Salina M.D.   On: 02/20/2021 18:14      Assessment/Plan   Shepard Keltz is a 70 y.o. male with medical history significant for GERD: Hypertension, chronic tobacco abuse, who is admitted to [facility] on 02/20/2021 by way of transfer from med [facility] with acute pancreatitis after presenting from home to [facility] complaining of abdominal pain.    Principal Problem:   Acute pancreatitis Active Problems:   Nausea & vomiting   Anemia   Leukocytosis   AKI (acute kidney injury) (HCC)   Hypokalemia   HTN (hypertension)     #) Acute pancreatitis: new diagnosis, in the setting of presenting abdominal pain, nausea/vomiting, elevated lipase, and presenting CT abdomen/pelvis which demonstrating findings consistent with acute pancreatitis, including progression of such on initial diagnosis on 02/07/2021 and spectated interval conservative measures, as further detailed above, in the absence of any associated evidence of necrosis, compressing mass, abscess. No evidence of associated infection, and patient appears hemodynamically stable.  Suspect  That this is alcohol-induced in etiology in the setting of reported alcohol consumption, as outlined above. Clinically, contribution from gallbladder  pathology is felt to be less likely, particularly given results of aforementioned imaging as well as presenting labs, which demonstrated no evidence to suggest an underlying cholestatic process. No other evidence of pharmacologic contribution at this time. Overall initial approach to treatment will involve aggressive IVF's and bowel rest with symptomatic management, as further described below.     Plan: NPO. LR @ 150 cc./hr. prn IV Dilaudid. prn IV Zofran. Recheck CMP and serum magnesium levels tomorrow morning, with electrolyte supplementation as needed. Check ionized calcium and triglyceride level.  GGT and direct bilirubin.      #) Hypokalemia: Presenting serum potassium mildly low at 3.4, in the setting of recent nausea/vomiting.  Plan: Potassium chloride 30 mEq IV over 3 hours x 1 now.  Repeat CMP in the morning.  Add on serum magnesium level.  Monitor on telemetry.  Work-up been management of underlying acute pancreatitis, as further described above.     #) Anion gap metabolic  acidosis: Presenting CMP reflects AGMA, which is suspected multifactorial in nature, with suspected contributions from starvation keto lactic acid doses given relative recent n.p.o. status in setting of acute pancreatitis as well as suspected contribution from acute kidney injury, as further described below.  No evidence of DKA.  Plan: Work-up and management of acute pancreatitis, as above, including IV fluids.  Work-up and management of presenting acute kidney injury, as above.  Repeat CMP in the morning.     #) Acute kidney injury: Presenting serum creatinine noted to be 1.52 relative to most recent prior value 0.90 on 01/29/2021.  Specie.  On nature of the basis of intravascular depletion stemming from dehydration due to recent acute pancreatitis as well as associated decline in oral intake and increased GI losses over that time, as further outlined above.  Urinalysis results, as further described above,  including elevated specific gravity, dehydration.  Plan:  Add on random urine sodium as well as random urine creatinine.  IV fluids as above.  Monitor strict I's and O's and daily weights.  Tempt avoid nephrotoxic agents.  Repeat CMP in the morning.     #) Leukocytosis: Appears to be reactive in the setting of acute pancreatitis as well as due to a likely component of hemoconcentration in the setting of dehydration.  No clinical evidence to suggest underlying infectious process at this time, including urinalysis which was inconsistent with UTI, while chest x-ray shows no evidence of acute cardiopulmonary process, and nasopharyngeal COVID-19/Fonzo PCR were found to be negative.  Plan: Work-up and management of acute pancreatitis, as above, including IV fluids.  Repeat CBC in the morning.     #) Anemia: Presenting hemoglobin found to be 9.1, down slightly from most recent prior value of 10.5 on 02/07/2021.  Unclear the chronicity of the patient's anemia, as I have been unable to locate any prior hemoglobin data points in the EMR for point comparison.  Presenting hemoglobin appears normocytic, normochromic, and in the absence of elevation of RDW.  No evidence of acute blood loss at this time.  Plan: Add on the following labs: Iron studies, MMA, folic acid, reticulocyte count.  Repeat CBC in the morning.  Check INR.     #) Essential hypertension: On Norvasc as an outpatient.  Systolic pressures have been mildly elevated, with suspected contribution from suboptimal pain control in setting of presenting acute pancreatitis.  Plan: will hold Norvasc for now in the setting of current n.p.o. status.  Close monitoring of ensuing pressure, including trend in blood pressure and response to attempts to further optimize abdominal pain control.     #) Chronic tobacco abuse: Patient acknowledges that he is a current smoker, smoking approximately 1/3 pack/day.  Over the last 10 years.  Plan: Counseled  patient on the importance of complete smoking discontinuation.     DVT prophylaxis: SCDs Code Status: Full code Family Communication: none Disposition Plan: Per Rounding Team Consults called: none  Admission status: Inpatient; MedSurg.     Of note, this patient was added by me to the following Admit List/Treatment Team:  mcadmits.     PLEASE NOTE THAT DRAGON DICTATION SOFTWARE WAS USED IN THE CONSTRUCTION OF THIS NOTE.   Angie Fava DO [facility] Pager [phone] From 7PM - 7AM   02/21/2021, 1:56 AM

## 2021-02-21 NOTE — Progress Notes (Incomplete)
Admissions paged

## 2021-02-21 NOTE — Progress Notes (Signed)
No charge progress note.  Manuel Castro is a 71 y.o. male with medical history significant for GERD: Hypertension, chronic tobacco abuse, who is admitted to [facility] on 02/20/2021 by way of transfer from med [facility] with acute pancreatitis after presenting from home to [facility] complaining of abdominal pain.  Per patient he is struggling with pancreatitis for the past 45-month.  Pain got really worse over the past 2 weeks and now associated with nausea and vomiting, unable to keep any food down. No history of alcohol abuse.  Triglyceride at 168. Elevated T bili and transaminase. CT abdomen concerning for pancreatitis and no obvious cholecystitis, cholelithiasis or bile duct dilatation.  Patient was seen this morning, continue to have abdominal pain but stating that it is not bearable after getting some pain medicine.  No more nausea and vomiting. On exam tenderness along epigastrium, right and left upper quadrant region. Bilateral hand contractures.  MRCP ordered due to some obstructive pattern on liver enzymes although CT abdomen did not show any significant abnormality.  We can start him on clear liquid diet after MRCP if no more nausea and vomiting. Can obtain GI consult depending on MRCP results.

## 2021-02-21 NOTE — Progress Notes (Signed)
Initial Nutrition Assessment  DOCUMENTATION CODES:   Severe malnutrition in context of acute illness/injury  INTERVENTION:  If unable to advanced diet soon, recommend initiation of nutrition support (post pyloric TF) given prolonged inadequate oral intake (at least 2 weeks) and pt is severely malnourished  Add MVI with Minerals daily  Add Thiamine 250 mg daily for 5 days   NUTRITION DIAGNOSIS:   Severe Malnutrition related to acute illness (acute pancreatitis, EtOH) as evidenced by energy intake < or equal to 50% for > or equal to 5 days,moderate fat depletion,severe fat depletion.  GOAL:   Patient will meet greater than or equal to 90% of their needs  MONITOR:   Supplement acceptance,Diet advancement,Labs,Weight trends  REASON FOR ASSESSMENT:   Malnutrition Screening Tool    ASSESSMENT:   70 yo male admitted with acute pancreatitis. Pt originally presented with complaints of abdominal pain 02/07/21 and discharged home for conservative management.  PMH includes GERD, HTN   Pt reports he has never had a good appetite, this has been life long. Pt typically eats one meal day with consists of veggies and tuna fish. Pt reports he has not had much to eat other than soup/broth over the past 2 weeks and even this has been minimal given significant abdominal pain with even water. Pt reports nausea, gagging/dry heaving, some vomiting.  Pt typical diet is very limited in that he does not really eat grain (no pasta, rice, bread) or dairy, eats a few fruits, does not eat beef or pork (except bacon). Pt would definitely benefit from MVI supplementation given limited diet and EtOH consumption. Per MD notes, pt reporting daily alcohol consumption.   Current wt 56 kg (123.5 pounds). Pt reports UBW around 125-130 pounds  Pt reports he has muscular dystrophy; he utilizes braces on his leg and uses a cane/walker to mobilize but gets around. Pt lives alone.   Pt denies any chewing or swallowing  problems  Pt with muscle wasting but given muscular dystrophy unsure how much if any muscle loss is related to nutrition. Pt does however have subcutaneous fat loss which RD suspects is related to nutrition  Labs: potassium 3.4 (L), phosphorus 2.2 (L), magnesium 1.6 (L), lipase 712 Meds: potassium chloride  NUTRITION - FOCUSED PHYSICAL EXAM:  Flowsheet Row Most Recent Value  Orbital Region Moderate depletion  Upper Arm Region Severe depletion  Thoracic and Lumbar Region Severe depletion  Buccal Region Moderate depletion  Temple Region Moderate depletion  Clavicle Bone Region Severe depletion  Clavicle and Acromion Bone Region Moderate depletion  Scapular Bone Region Moderate depletion  Dorsal Hand Severe depletion  Patellar Region Severe depletion  Anterior Thigh Region Severe depletion  Posterior Calf Region Severe depletion  Edema (RD Assessment) Mild       Diet Order:   Diet Order            Diet NPO time specified  Diet effective now                 EDUCATION NEEDS:   Education needs have been addressed  Skin:  Skin Assessment: Reviewed RN Assessment  Last BM:  4/11  Height:   Ht Readings from Last 1 Encounters:  02/20/21 5\' 8"  (1.727 m)    Weight:   Wt Readings from Last 1 Encounters:  02/20/21 56 kg    BMI:  Body mass index is 18.77 kg/m.  Estimated Nutritional Needs:   Kcal:  1950-2250 kcals  Protein:  95-115 g  Fluid:  >/=  2 L   Romelle Starcher MS, RDN, LDN, CNSC Registered Dietitian III Clinical Nutrition RD Pager and On-Call Pager Number Located in [place]

## 2021-02-22 DIAGNOSIS — E860 Dehydration: Secondary | ICD-10-CM

## 2021-02-22 DIAGNOSIS — E43 Unspecified severe protein-calorie malnutrition: Secondary | ICD-10-CM

## 2021-02-22 DIAGNOSIS — G600 Hereditary motor and sensory neuropathy: Secondary | ICD-10-CM

## 2021-02-22 LAB — CBC
HCT: 20.5 % — ABNORMAL LOW (ref 39.0–52.0)
HCT: 24.7 % — ABNORMAL LOW (ref 39.0–52.0)
Hemoglobin: 6.8 g/dL — CL (ref 13.0–17.0)
Hemoglobin: 8.3 g/dL — ABNORMAL LOW (ref 13.0–17.0)
MCH: 31.6 pg (ref 26.0–34.0)
MCH: 31.6 pg (ref 26.0–34.0)
MCHC: 33.2 g/dL (ref 30.0–36.0)
MCHC: 33.6 g/dL (ref 30.0–36.0)
MCV: 95.3 fL (ref 80.0–100.0)
MCV: 93.9 fL (ref 80.0–100.0)
Platelets: 417 10*3/uL — ABNORMAL HIGH (ref 150–400)
Platelets: 380 10*3/uL (ref 150–400)
RBC: 2.15 MIL/uL — ABNORMAL LOW (ref 4.22–5.81)
RBC: 2.63 MIL/uL — ABNORMAL LOW (ref 4.22–5.81)
RDW: 14.4 % (ref 11.5–15.5)
RDW: 14.3 % (ref 11.5–15.5)
WBC: 7.8 10*3/uL (ref 4.0–10.5)
WBC: 6.3 10*3/uL (ref 4.0–10.5)
nRBC: 0 % (ref 0.0–0.2)
nRBC: 0 % (ref 0.0–0.2)

## 2021-02-22 LAB — COMPREHENSIVE METABOLIC PANEL
ALT: 13 U/L (ref 0–44)
AST: 16 U/L (ref 15–41)
Albumin: 1.6 g/dL — ABNORMAL LOW (ref 3.5–5.0)
Alkaline Phosphatase: 111 U/L (ref 38–126)
Anion gap: 6 (ref 5–15)
BUN: 11 mg/dL (ref 8–23)
CO2: 22 mmol/L (ref 22–32)
Calcium: 8 mg/dL — ABNORMAL LOW (ref 8.9–10.3)
Chloride: 112 mmol/L — ABNORMAL HIGH (ref 98–111)
Creatinine, Ser: 1.01 mg/dL (ref 0.61–1.24)
GFR, Estimated: >60 mL/min (ref >60)
Glucose, Bld: 105 mg/dL — ABNORMAL HIGH (ref 70–99)
Potassium: 3.7 mmol/L (ref 3.5–5.1)
Sodium: 140 mmol/L (ref 135–145)
Total Bilirubin: 0.8 mg/dL (ref 0.3–1.2)
Total Protein: 4.4 g/dL — ABNORMAL LOW (ref 6.5–8.1)

## 2021-02-22 LAB — PREPARE RBC (CROSSMATCH)

## 2021-02-22 LAB — CALCIUM, IONIZED: Calcium, Ionized, Serum: 4.8 mg/dL (ref 4.5–5.6)

## 2021-02-22 LAB — ABO/RH: ABO/RH(D): O POS

## 2021-02-22 MED ORDER — DORZOLAMIDE HCL-TIMOLOL MAL 2-0.5 % OP SOLN
1.0000 [drp] | Freq: Two times a day (BID) | OPHTHALMIC | Status: DC
  Administered 2021-02-22 – 2021-02-25 (×7): 1 [drp] via OPHTHALMIC
  Filled 2021-02-22: qty 10

## 2021-02-22 MED ORDER — PANTOPRAZOLE SODIUM 40 MG IV SOLR
40.0000 mg | Freq: Two times a day (BID) | INTRAVENOUS | Status: DC
  Administered 2021-02-22 – 2021-02-24 (×4): 40 mg via INTRAVENOUS
  Filled 2021-02-22 (×4): qty 40

## 2021-02-22 MED ORDER — SODIUM CHLORIDE 0.9% IV SOLUTION: Freq: Once | INTRAVENOUS | Status: DC

## 2021-02-22 MED ORDER — MAGNESIUM SULFATE 2 GM/50ML IV SOLN
2.0000 g | Freq: Once | INTRAVENOUS | Status: AC
  Administered 2021-02-22: 2 g via INTRAVENOUS
  Filled 2021-02-22: qty 50

## 2021-02-22 MED ORDER — PANTOPRAZOLE SODIUM 40 MG IV SOLR
40.0000 mg | INTRAVENOUS | Status: DC
  Administered 2021-02-22: 40 mg via INTRAVENOUS
  Filled 2021-02-22: qty 40

## 2021-02-22 MED ORDER — HYDRALAZINE HCL 20 MG/ML IJ SOLN: 10.0000 mg | Freq: Four times a day (QID) | INTRAMUSCULAR | Status: DC | PRN

## 2021-02-22 NOTE — Consult Note (Signed)
Referring Provider: Dr. Kerry Hough Primary Care Physician:  Margot Ables, MD Primary Gastroenterologist:  Dr. Bosie Clos [facility])  Reason for Consultation:  Pancreatitis, anemia  HPI: Manuel Castro is a 70 y.o. male with history of pancreatitis presenting for consultation.  Patient presented to the ED 02/20/21 with worsening abdominal pain.  He was diagnosed with pancreatitis on 02/07/21.  Patient reports continued upper abdominal pain, though states it is slightly improved since admission.  He denies any current nausea or vomiting.  He states he has not had a stool for several days.  When asked if he has had any black or red stools, he stated he did not know.  He denies any family history of pancreatitis or other gastrointestinal disorders or malignancies.  He denies any new medications.  He states he drinks socially, approximately 1 drink per week.  Denies any aspirin, NSAID, or blood thinner use.  Patient is upset that his feet and legs are swollen, and he asked that I stop his IV fluids, stating if we do not stop the fluids, he will "rip it out myself."  Tried to discuss importance of IV fluids in the setting of pancreatitis but he was insistent that fluids be stopped or he would remove the IV himself.  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 No findings for pancreatic necrosis. Normal caliber and course of the common bile duct. No definite common bile duct stones. Mild gallbladder distention but no definite gallstones.  Past Medical History:  Diagnosis Date  . Chronic pain    b/l knees  . CMT (Charcot-Marie-Tooth disease)   . Depression   . GERD (gastroesophageal reflux disease)   . HTN (hypertension)     Past Surgical History:  Procedure Laterality Date  . ADENOIDECTOMY    . FOOT SURGERY Bilateral   . HERNIA REPAIR    . NECK SURGERY    . TONSILLECTOMY      Prior to Admission medications    Medication Sig Start Date End Date Taking? Authorizing Provider  dorzolamide-timolol (COSOPT) 22.3-6.8 MG/ML ophthalmic solution Place 1 drop into both eyes 2 (two) times daily. 01/16/21  Yes [provider]  gabapentin (NEURONTIN) 600 MG tablet Take 600 mg by mouth 3 (three) times daily. 11/18/20  Yes [provider]  traMADol (ULTRAM) 50 MG tablet Take 50 mg by mouth 3 (three) times daily as needed for pain. 01/17/21  Yes [provider]  traZODone (DESYREL) 100 MG tablet Take 100 mg by mouth at bedtime. 01/16/21  Yes [provider]    Scheduled Meds: . sodium chloride   Intravenous Once  . dorzolamide-timolol  1 drop Both Eyes BID  . pantoprazole (PROTONIX) IV  40 mg Intravenous Q24H   Continuous Infusions: . lactated ringers 75 mL/hr at 02/22/21 1418   PRN Meds:.HYDROmorphone (DILAUDID) injection, naLOXone (NARCAN)  injection, ondansetron (ZOFRAN) IV  Allergies as of 02/20/2021  . (No Known Allergies)    Family History  Problem Relation Age of Onset  . Cancer Mother   . Heart attack Mother   . Diabetes Father   . Heart attack Father     Social History   Socioeconomic History  . Marital status: Divorced    Spouse name: Not on file  . Number of children: Not on file  . Years of education: Not on file  . Highest education level: Not on file  Occupational History  . Not on file  Tobacco Use  . Smoking status:  Current Every Day Smoker    Packs/day: 0.35    Types: Cigarettes  . Smokeless tobacco: Never Used  Vaping Use  . Vaping Use: Never used  Substance and Sexual Activity  . Alcohol use: Yes  . Drug use: Not Currently  . Sexual activity: Not on file  Other Topics Concern  . Not on file  Social History Narrative  . Not on file   Social Determinants of Health   Financial Resource Strain: Not on file  Food Insecurity: Not on file  Transportation Needs: Not on file  Physical Activity: Not on file  Stress: Not on file  Social  Connections: Not on file  Intimate Partner Violence: Not on file    Review of Systems: Review of Systems  Constitutional: Negative for chills and fever.  HENT: Negative for hearing loss and tinnitus.   Eyes: Negative for pain and redness.  Respiratory: Negative for cough and sputum production.   Cardiovascular: Positive for leg swelling. Negative for chest pain.  Gastrointestinal: Positive for abdominal pain. Negative for constipation, diarrhea, heartburn, nausea and vomiting.  Genitourinary: Negative for flank pain and hematuria.  Musculoskeletal: Negative for falls and joint pain.  Skin: Negative for itching and rash.  Neurological: Negative for seizures and loss of consciousness.  Endo/Heme/Allergies: Negative for polydipsia. Does not bruise/bleed easily.  Psychiatric/Behavioral: Negative for substance abuse. The patient is not nervous/anxious.      Physical Exam: Vital signs: Vitals:   02/22/21 1234 02/22/21 1341  BP: (!) 153/89 (!) 174/78  Pulse: 72 66  Resp: 18   Temp: 98.1 F (36.7 C) 98 F (36.7 C)  SpO2: 98% 100%   Last BM Date: 02/20/21  Physical Exam Vitals reviewed.  Constitutional:      General: He is not in acute distress.    Appearance: He is underweight.  HENT:     Head: Normocephalic and atraumatic.     Nose: Nose normal. No congestion.     Mouth/Throat:     Mouth: Mucous membranes are moist.     Pharynx: Oropharynx is clear.  Eyes:     General: No scleral icterus.    Extraocular Movements: Extraocular movements intact.     Comments: Conjunctival pallor  Cardiovascular:     Rate and Rhythm: Normal rate and regular rhythm.     Pulses: Normal pulses.  Pulmonary:     Effort: Pulmonary effort is normal. No respiratory distress.     Breath sounds: Normal breath sounds.  Abdominal:     General: Bowel sounds are normal. There is no distension.     Palpations: Abdomen is soft. There is no mass.     Tenderness: There is abdominal tenderness  (Epigastric). There is guarding (Voluntary). There is no rebound.     Hernia: No hernia is present.  Musculoskeletal:        General: No tenderness.     Cervical back: Normal range of motion and neck supple.     Right lower leg: Edema present.     Left lower leg: Edema present.  Skin:    General: Skin is warm and dry.     Coloration: Skin is not jaundiced.  Neurological:     General: No focal deficit present.     Mental Status: He is alert and oriented to person, place, and time.  Psychiatric:        Mood and Affect: Mood normal.        Behavior: Behavior normal.      GI:    Lab Results: Recent Labs    02/21/21 0302 02/22/21 0103 02/22/21 1445  WBC 10.5 7.8 6.3  HGB 8.2* 6.8* 8.3*  HCT 25.1* 20.5* 24.7*  PLT 597* 417* 380   BMET Recent Labs    02/20/21 1344 02/21/21 0302 02/22/21 0103  NA 139 140 140  K 3.4* 3.4* 3.7  CL 106 112* 112*  CO2 11* 15* 22  GLUCOSE 97 69* 105*  BUN 19 14 11   CREATININE 1.52* 1.26* 1.01  CALCIUM 8.2* 8.0* 8.0*   LFT Recent Labs    02/21/21 0302 02/22/21 0103  PROT 5.4* 4.4*  ALBUMIN 2.0* 1.6*  AST 14* 16  ALT 14 13  ALKPHOS 148* 111  BILITOT 1.3* 0.8  BILIDIR 0.1  --    PT/INR Recent Labs    02/21/21 0302  LABPROT 15.4*  INR 1.3*     Studies/Results: CT ABDOMEN PELVIS W CONTRAST  Result Date: 02/20/2021 CLINICAL DATA:  Epigastric pain EXAM: CT ABDOMEN AND PELVIS WITH CONTRAST TECHNIQUE: Multidetector CT imaging of the abdomen and pelvis was performed using the standard protocol following bolus administration of intravenous contrast. CONTRAST:  04/22/2021 OMNIPAQUE IOHEXOL 300 MG/ML  SOLN COMPARISON:  02/07/2021 FINDINGS: Lower chest: No acute pleural or parenchymal lung disease. Hepatobiliary: No focal liver abnormality is seen. No gallstones, gallbladder wall thickening, or biliary dilatation. Pancreas: Inflammatory changes are again seen surrounding the pancreas, consistent with acute pancreatitis. The parenchyma enhances  normally. Parenchymal calcifications again noted within the uncinate process. In the interim since prior study, multilocular fluid collections have developed compatible with pseudocysts. The largest is seen extending from the pancreatic head into the porta hepatis measuring 2.6 x 3.1 cm reference image 19/3. Multilocular collections along the ventral aspect of the pancreatic body are also noted, measuring up to 1.9 x 1.9 cm reference image 31/3. Spleen: Normal in size without focal abnormality. Adrenals/Urinary Tract: Stable chronic severe left renal atrophy, with continued dilatation of the left renal pelvis and proximal left ureter. Compensatory hypertrophy of the right kidney, with numerous right renal cysts unchanged. No evidence of urinary tract calculi. The bladder and adrenals are unremarkable. Stomach/Bowel: There is no evidence of bowel obstruction. Scattered gas fluid levels within several loops of jejunum within the left upper quadrant could reflect mild ileus. There is wall thickening of the ascending and proximal transverse colon, likely due to the adjacent inflammatory changes due to acute pancreatitis. The structure within the proximal colonic lumen on prior study has passed in the interim, consistent with stool. No evidence of persistent mass. The appendix is normal. Vascular/Lymphatic: Aortic atherosclerosis. No enlarged abdominal or pelvic lymph nodes. Reproductive: Prostate is unremarkable. Right-sided hydrocele again partially visualized. Other: Trace free fluid within the right paracolic gutter. No free intraperitoneal gas. Small fat containing right inguinal hernia. No bowel herniation. Musculoskeletal: No acute or destructive bony lesions. Reconstructed images demonstrate stable multilevel lumbar spondylosis greatest at L2-3, L4-5, and L5-S1. IMPRESSION: 1. Progression of acute pancreatitis, with interval development of multilocular fluid collections consistent with pseudocysts. 2. Mild wall  thickening of decompressed proximal colon, likely due to the adjacent inflammatory changes due to pancreatitis. 3. Scattered gas fluid levels within loops of jejunum in the left upper quadrant which may reflect mild ileus. No bowel obstruction. 4. Other stable findings throughout the abdomen and pelvis as above. Electronically Signed   By: 21/3 M.D.   On: 02/20/2021 18:14   MR ABDOMEN MRCP W WO CONTAST  Result Date: 02/21/2021 CLINICAL DATA:  Pancreatitis. EXAM: MRI  ABDOMEN WITHOUT AND WITH CONTRAST (INCLUDING MRCP) TECHNIQUE: Multiplanar multisequence MR imaging of the abdomen was performed both before and after the administration of intravenous contrast. Heavily T2-weighted images of the biliary and pancreatic ducts were obtained, and three-dimensional MRCP images were rendered by post processing. CONTRAST:  5.58mL GADAVIST GADOBUTROL 1 MMOL/ML IV SOLN COMPARISON:  CT scan 02/20/2021 FINDINGS: Examination limited by respiratory motion. Lower chest: The lung bases are clear of an acute process. No pulmonary lesions or pleural effusions. Hepatobiliary: No hepatic lesions or intrahepatic biliary dilatation. The gallbladder is mildly distended. No gallstones or gallbladder wall thickening. Normal caliber and course of the common bile duct. No definite common bile duct stones are identified. 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 These are stable. No findings for pancreatic necrosis. No abscess or hematoma. Spleen:  Normal size. No focal lesions. Adrenals/Urinary Tract: Adrenal glands are unremarkable. Small atrophic left kidney. Stable right renal cysts. Stomach/Bowel: The stomach, duodenum, visualized small bowel and visualized colon are grossly normal. Vascular/Lymphatic: The aorta and branch vessels are patent. The major venous structures are patent. Scattered mesenteric and retroperitoneal lymph nodes are unchanged and likely  reactive/inflammatory. Other:  Mesenteric edema and scattered fluid but no overt ascites. Musculoskeletal: No significant bony findings. IMPRESSION: 1. 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 No findings for pancreatic necrosis. 2. Normal caliber and course of the common bile duct. No definite common bile duct stones. 3. Mild gallbladder distention but no definite gallstones. 4. No other acute abdominal findings, mass lesions or adenopathy. Electronically Signed   By: Rudie Meyer M.D.   On: 02/21/2021 16:56    Impression: Acute on chronic anemia: Hemoglobin decreased to 6.8 today as compared to 8.2 yesterday and 9.1 on admission 4/11.  Hemoglobin was 11.1 as of 02/07/2021.  Part of decreased may be delusional, though cannot rule out GI bleeding as source of ongoing drop in hemoglobin.  Pancreatitis: Idiopathic versus possibly related to alcohol, though patient denies any significant alcohol use (1 drink per week).  No gallstones or biliary abnormality on MRI/MRCP. -Normal renal function: BUN 11/creatinine 1.01, improved from admission BUN 19/creatinine 1.52 -Normal LFTs today: T. Bili 0.8/ AST 16/ ALT 13/ ALP 111 (LFTs were slightly elevated upon admission)  Plan: EGD tomorrow due to progressive anemia.  I thoroughly discussed the procedure with the patient to include nature, alternatives, benefits, and risks (including but not limited to bleeding, infection, perforation, anesthesia/cardiac and pulmonary complications).  Patient verbalized understanding and gave verbal consent to proceed with EGD.  Clear liquid diet with NPO after midnight.  Restart IV fluids if patient is amenable, and as tolerated.  Increase Protonix to 40 mg IV twice daily dosing.  [facility] will follow.   LOS: 2 days   Edrick Kins  PA-C 02/22/2021, 3:21 PM  Contact #  [phone]

## 2021-02-22 NOTE — Progress Notes (Signed)
PROGRESS NOTE    Manuel Castro  LGX:[id number] DOB: 26-Jun-1951 DOA: 02/20/2021 PCP: Margot Ables, MD    Brief Narrative:  70 y/o male admitted with acute pancreatitis. Started on bowel rest, IV fluids and pain management. Noted to have significant decline in hemoglobin and was transfused 1 prbc on 4/13. He did describe dark stools prior to admission. GI consulted.   Assessment & Plan:   Principal Problem:   Acute pancreatitis Active Problems:   Nausea & vomiting   Anemia   Leukocytosis   AKI (acute kidney injury) (HCC)   Hypokalemia   HTN (hypertension)   Protein-calorie malnutrition, severe   Acute pancreatitis -Admitted with lipase of 712 -CT abdomen confirmed pancreatitis with interval development of multilocular fluid collections consistent with pseudocysts -He was started on clear liquids, but continued to have significant pain -he has been changed to NPO -continue pain management -he is receiving IV fluids, but will have to be cautious since his albumin is low and he is prone to third space fluids -denies any recent alcohol use -MRCP did not show any bile duct abnormalities  Anemia -baseline hgb of 10 -admission hgb 9.1 -likely has a dilutional component -may have had some GI blood loss since he describes dark stools prior to admission -Hgb down to 6.8 on 4/13 s/p 1 unit prbc -GI consulted to see if EGD is indicated, started on protonix -continue to follow hemoglobin  HTN -he was previously on amlodipine, but reports stopping this some time back -blood pressure likely elevated in the setting of pain -continue to follow and use hydralazine prn  Hypokalemia -replace  AKI -creatinine of 1.5 on admission -improved with IV hydration  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   Difficult to place patient No    Consultants:     Procedures:     Antimicrobials:       Subjective: Patient says he feels dizzy, lightheaded, weak.  He continues to have abdominal pain which is diffuse.  He has nausea, but no vomiting.  No bowel movements.  Objective: Vitals:   02/22/21 0940 02/22/21 1013 02/22/21 1234 02/22/21 1341  BP: (!) 157/75 (!) 166/75 (!) 153/89 (!) 174/78  Pulse: 67 71 72 66  Resp: 18 18 18    Temp: 98.2 F (36.8 C) 98.1 F (36.7 C) 98.1 F (36.7 C) 98 F (36.7 C)  TempSrc: Oral Oral Oral Oral  SpO2: 97% 98% 98% 100%  Weight:      Height:        Intake/Output Summary (Last 24 hours) at 02/22/2021 1510 Last data filed at 02/22/2021 1234 Gross per 24 hour  Intake 2602.34 ml  Output 300 ml  Net 2302.34 ml   Filed Weights   02/20/21 2105 02/20/21 2329 02/22/21 0606  Weight: 58.2 kg 56 kg 56 kg    Examination:  General exam: Appears calm and comfortable  Respiratory system: Clear to auscultation. Respiratory effort normal. Cardiovascular system: S1 & S2 heard, RRR. No JVD, murmurs, rubs, gallops or clicks. No pedal edema. Gastrointestinal system: Abdomen is distended, soft and diffusely tender. No organomegaly or masses felt. Normal bowel sounds heard. Central nervous system: Alert and oriented. No focal neurological deficits. Extremities: Symmetric 5 x 5 power. Skin: No rashes, lesions or ulcers Psychiatry: Judgement and insight appear normal. Mood & affect appropriate.     Data Reviewed: I have personally reviewed following labs  and imaging studies  CBC: Recent Labs  Lab 02/20/21 1344 02/21/21 0302 02/22/21 0103 02/22/21 1445  WBC 14.4* 10.5 7.8 6.3  HGB 9.1* 8.2* 6.8* 8.3*  HCT 29.0* 25.1* 20.5* 24.7*  MCV 99.0 96.5 95.3 93.9  PLT 729* 597* 417* 380   Basic Metabolic Panel: Recent Labs  Lab 02/20/21 1344 02/21/21 0302 02/22/21 0103  NA 139 140 140  K 3.4* 3.4* 3.7  CL 106 112* 112*  CO2 11* 15* 22  GLUCOSE 97 69* 105*  BUN 19 14 11   CREATININE 1.52* 1.26* 1.01  CALCIUM 8.2* 8.0* 8.0*  MG  --  1.6*  --   PHOS  --  2.2*  --    GFR: Estimated Creatinine Clearance: 54.7 mL/min (by C-G formula based on SCr of 1.01 mg/dL). Liver Function Tests: Recent Labs  Lab 02/20/21 1344 02/21/21 0302 02/22/21 0103  AST 14* 14* 16  ALT 16 14 13   ALKPHOS 168* 148* 111  BILITOT 2.6* 1.3* 0.8  PROT 5.7* 5.4* 4.4*  ALBUMIN 2.1* 2.0* 1.6*   Recent Labs  Lab 02/20/21 1344  LIPASE 712*   No results for input(s): AMMONIA in the last 168 hours. Coagulation Profile: Recent Labs  Lab 02/21/21 0302  INR 1.3*   Cardiac Enzymes: No results for input(s): CKTOTAL, CKMB, CKMBINDEX, TROPONINI in the last 168 hours. BNP (last 3 results) No results for input(s): PROBNP in the last 8760 hours. HbA1C: No results for input(s): HGBA1C in the last 72 hours. CBG: No results for input(s): GLUCAP in the last 168 hours. Lipid Profile: Recent Labs    02/21/21 0302  TRIG 168*   Thyroid Function Tests: No results for input(s): TSH, T4TOTAL, FREET4, T3FREE, THYROIDAB in the last 72 hours. Anemia Panel: Recent Labs    02/21/21 0302  FOLATE 23.1  FERRITIN 435*  TIBC 118*  IRON 25*  RETICCTPCT 1.6   Sepsis Labs: No results for input(s): PROCALCITON, LATICACIDVEN in the last 168 hours.  Recent Results (from the past 240 hour(s))  Resp Panel by RT-PCR (Flu A&B, Covid) Nasopharyngeal Swab     Status: None   Collection Time: 02/20/21  9:13 PM   Specimen: Nasopharyngeal Swab; Nasopharyngeal(NP) swabs in vial  transport medium  Result Value Ref Range Status   SARS Coronavirus 2 by RT PCR NEGATIVE NEGATIVE Final    Comment: (NOTE) SARS-CoV-2 target nucleic acids are NOT DETECTED.  The SARS-CoV-2 RNA is generally detectable in upper respiratory specimens during the acute phase of infection. The lowest concentration of SARS-CoV-2 viral copies this assay can detect is 138 copies/mL. A negative result does not preclude SARS-Cov-2 infection and should not be used as the sole basis for treatment or other patient management decisions. A negative result may occur with  improper specimen collection/handling, submission of specimen other than nasopharyngeal swab, presence of viral mutation(s) within the areas targeted by this assay, and inadequate number of viral copies(<138 copies/mL). A negative result must be combined with clinical observations, patient history, and  epidemiological information. The expected result is Negative.  Fact Sheet for Patients:  BloggerCourse.comhttps://www.fda.gov/media/152166/download  Fact Sheet for Healthcare Providers:  SeriousBroker.ithttps://www.fda.gov/media/152162/download  This test is no t yet approved or cleared by the [place] FDA and  has been authorized for detection and/or diagnosis of SARS-CoV-2 by FDA under an Emergency Use Authorization (EUA). This EUA will remain  in effect (meaning this test can be used) for the duration of the COVID-19 declaration under Section 564(b)(1) of the Act, 21 U.S.C.section 360bbb-3(b)(1), unless the authorization is terminated  or revoked sooner.       Influenza A by PCR NEGATIVE NEGATIVE Final   Influenza B by PCR NEGATIVE NEGATIVE Final    Comment: (NOTE) The Xpert Xpress SARS-CoV-2/FLU/RSV plus assay is intended as an aid in the diagnosis of influenza from Nasopharyngeal swab specimens and should not be used as a sole basis for treatment. Nasal washings and aspirates are unacceptable for Xpert Xpress SARS-CoV-2/FLU/RSV testing.  Fact  Sheet for Patients: BloggerCourse.comhttps://www.fda.gov/media/152166/download  Fact Sheet for Healthcare Providers: SeriousBroker.ithttps://www.fda.gov/media/152162/download  This test is not yet approved or cleared by the [place] FDA and has been authorized for detection and/or diagnosis of SARS-CoV-2 by FDA under an Emergency Use Authorization (EUA). This EUA will remain in effect (meaning this test can be used) for the duration of the COVID-19 declaration under Section 564(b)(1) of the Act, 21 U.S.C. section 360bbb-3(b)(1), unless the authorization is terminated or revoked.  Performed at [facility]          Radiology Studies: CT ABDOMEN PELVIS W CONTRAST  Result Date: 02/20/2021 CLINICAL DATA:  Epigastric pain EXAM: CT ABDOMEN AND PELVIS WITH CONTRAST TECHNIQUE: Multidetector CT imaging of the abdomen and pelvis was performed using the standard protocol following bolus administration of intravenous contrast. CONTRAST:  100mL OMNIPAQUE IOHEXOL 300 MG/ML  SOLN COMPARISON:  02/07/2021 FINDINGS: Lower chest: No acute pleural or parenchymal lung disease. Hepatobiliary: No focal liver abnormality is seen. No gallstones, gallbladder wall thickening, or biliary dilatation. Pancreas: Inflammatory changes are again seen surrounding the pancreas, consistent with acute pancreatitis. The parenchyma enhances normally. Parenchymal calcifications again noted within the uncinate process. In the interim since prior study, multilocular fluid collections have developed compatible with pseudocysts. The largest is seen extending from the pancreatic head into the porta hepatis measuring 2.6 x 3.1 cm reference image 19/3. Multilocular collections along the ventral aspect of the pancreatic body are also noted, measuring up to 1.9 x 1.9 cm reference image 31/3. Spleen: Normal in size without focal abnormality. Adrenals/Urinary Tract: Stable chronic severe left renal atrophy, with continued dilatation of the left renal  pelvis and proximal left ureter. Compensatory hypertrophy of the right kidney, with numerous right renal cysts unchanged. No evidence of urinary tract calculi. The bladder and adrenals are unremarkable. Stomach/Bowel: There is no evidence of bowel obstruction. Scattered gas fluid levels within several loops of jejunum within the left upper quadrant could reflect mild ileus. There is wall thickening of the ascending and proximal transverse colon, likely due to the adjacent inflammatory changes due to acute pancreatitis. The structure within the proximal colonic lumen on prior study has passed in the interim, consistent with stool. No evidence of persistent mass. The appendix is normal. Vascular/Lymphatic: Aortic atherosclerosis. No enlarged abdominal or pelvic lymph nodes. Reproductive: Prostate is unremarkable. Right-sided hydrocele again partially visualized. Other: Trace free fluid within the right paracolic gutter. No free intraperitoneal gas. Small fat containing right inguinal hernia. No bowel herniation. Musculoskeletal: No acute or destructive bony lesions.  Reconstructed images demonstrate stable multilevel lumbar spondylosis greatest at L2-3, L4-5, and L5-S1. IMPRESSION: 1. Progression of acute pancreatitis, with interval development of multilocular fluid collections consistent with pseudocysts. 2. Mild wall thickening of decompressed proximal colon, likely due to the adjacent inflammatory changes due to pancreatitis. 3. Scattered gas fluid levels within loops of jejunum in the left upper quadrant which may reflect mild ileus. No bowel obstruction. 4. Other stable findings throughout the abdomen and pelvis as above. Electronically Signed   By: Sharlet Salina M.D.   On: 02/20/2021 18:14   MR ABDOMEN MRCP W WO CONTAST  Result Date: 02/21/2021 CLINICAL DATA:  Pancreatitis. EXAM: MRI ABDOMEN WITHOUT AND WITH CONTRAST (INCLUDING MRCP) TECHNIQUE: Multiplanar multisequence MR imaging of the abdomen was  performed both before and after the administration of intravenous contrast. Heavily T2-weighted images of the biliary and pancreatic ducts were obtained, and three-dimensional MRCP images were rendered by post processing. CONTRAST:  5.35mL GADAVIST GADOBUTROL 1 MMOL/ML IV SOLN COMPARISON:  CT scan 02/20/2021 FINDINGS: Examination limited by respiratory motion. Lower chest: The lung bases are clear of an acute process. No pulmonary lesions or pleural effusions. Hepatobiliary: No hepatic lesions or intrahepatic biliary dilatation. The gallbladder is mildly distended. No gallstones or gallbladder wall thickening. Normal caliber and course of the common bile duct. No definite common bile duct stones are identified. 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 These are stable. No findings for pancreatic necrosis. No abscess or hematoma. Spleen:  Normal size. No focal lesions. Adrenals/Urinary Tract: Adrenal glands are unremarkable. Small atrophic left kidney. Stable right renal cysts. Stomach/Bowel: The stomach, duodenum, visualized small bowel and visualized colon are grossly normal. Vascular/Lymphatic: The aorta and branch vessels are patent. The major venous structures are patent. Scattered mesenteric and retroperitoneal lymph nodes are unchanged and likely reactive/inflammatory. Other:  Mesenteric edema and scattered fluid but no overt ascites. Musculoskeletal: No significant bony findings. IMPRESSION: 1. 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 No findings for pancreatic necrosis. 2. Normal caliber and course of the common bile duct. No definite common bile duct stones. 3. Mild gallbladder distention but no definite gallstones. 4. No other acute abdominal findings, mass lesions or adenopathy. Electronically Signed   By: Rudie Meyer M.D.   On: 02/21/2021 16:56        Scheduled Meds: . sodium chloride    Intravenous Once  . dorzolamide-timolol  1 drop Both Eyes BID  . pantoprazole (PROTONIX) IV  40 mg Intravenous Q24H   Continuous Infusions: . lactated ringers 75 mL/hr at 02/22/21 1418     LOS: 2 days    Time spent: 35 mins    Erick Blinks, MD [facility]   If 7PM-7AM, please contact night-coverage [email or web address]  02/22/2021, 3:10 PM

## 2021-02-22 NOTE — H&P (View-Only) (Signed)
Referring Provider: Dr. Kerry Hough Primary Care Physician:  Margot Ables, MD Primary Gastroenterologist:  Dr. Bosie Clos [facility])  Reason for Consultation:  Pancreatitis, anemia  HPI: Manuel Castro is a 70 y.o. male with history of pancreatitis presenting for consultation.  Patient presented to the ED 02/20/21 with worsening abdominal pain.  He was diagnosed with pancreatitis on 02/07/21.  Patient reports continued upper abdominal pain, though states it is slightly improved since admission.  He denies any current nausea or vomiting.  He states he has not had a stool for several days.  When asked if he has had any black or red stools, he stated he did not know.  He denies any family history of pancreatitis or other gastrointestinal disorders or malignancies.  He denies any new medications.  He states he drinks socially, approximately 1 drink per week.  Denies any aspirin, NSAID, or blood thinner use.  Patient is upset that his feet and legs are swollen, and he asked that I stop his IV fluids, stating if we do not stop the fluids, he will "rip it out myself."  Tried to discuss importance of IV fluids in the setting of pancreatitis but he was insistent that fluids be stopped or he would remove the IV himself.  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 No findings for pancreatic necrosis. Normal caliber and course of the common bile duct. No definite common bile duct stones. Mild gallbladder distention but no definite gallstones.  Past Medical History:  Diagnosis Date  . Chronic pain    b/l knees  . CMT (Charcot-Marie-Tooth disease)   . Depression   . GERD (gastroesophageal reflux disease)   . HTN (hypertension)     Past Surgical History:  Procedure Laterality Date  . ADENOIDECTOMY    . FOOT SURGERY Bilateral   . HERNIA REPAIR    . NECK SURGERY    . TONSILLECTOMY      Prior to Admission medications    Medication Sig Start Date End Date Taking? Authorizing Provider  dorzolamide-timolol (COSOPT) 22.3-6.8 MG/ML ophthalmic solution Place 1 drop into both eyes 2 (two) times daily. 01/16/21  Yes [provider]  gabapentin (NEURONTIN) 600 MG tablet Take 600 mg by mouth 3 (three) times daily. 11/18/20  Yes [provider]  traMADol (ULTRAM) 50 MG tablet Take 50 mg by mouth 3 (three) times daily as needed for pain. 01/17/21  Yes [provider]  traZODone (DESYREL) 100 MG tablet Take 100 mg by mouth at bedtime. 01/16/21  Yes [provider]    Scheduled Meds: . sodium chloride   Intravenous Once  . dorzolamide-timolol  1 drop Both Eyes BID  . pantoprazole (PROTONIX) IV  40 mg Intravenous Q24H   Continuous Infusions: . lactated ringers 75 mL/hr at 02/22/21 1418   PRN Meds:.HYDROmorphone (DILAUDID) injection, naLOXone (NARCAN)  injection, ondansetron (ZOFRAN) IV  Allergies as of 02/20/2021  . (No Known Allergies)    Family History  Problem Relation Age of Onset  . Cancer Mother   . Heart attack Mother   . Diabetes Father   . Heart attack Father     Social History   Socioeconomic History  . Marital status: Divorced    Spouse name: Not on file  . Number of children: Not on file  . Years of education: Not on file  . Highest education level: Not on file  Occupational History  . Not on file  Tobacco Use  . Smoking status:  Current Every Day Smoker    Packs/day: 0.35    Types: Cigarettes  . Smokeless tobacco: Never Used  Vaping Use  . Vaping Use: Never used  Substance and Sexual Activity  . Alcohol use: Yes  . Drug use: Not Currently  . Sexual activity: Not on file  Other Topics Concern  . Not on file  Social History Narrative  . Not on file   Social Determinants of Health   Financial Resource Strain: Not on file  Food Insecurity: Not on file  Transportation Needs: Not on file  Physical Activity: Not on file  Stress: Not on file  Social  Connections: Not on file  Intimate Partner Violence: Not on file    Review of Systems: Review of Systems  Constitutional: Negative for chills and fever.  HENT: Negative for hearing loss and tinnitus.   Eyes: Negative for pain and redness.  Respiratory: Negative for cough and sputum production.   Cardiovascular: Positive for leg swelling. Negative for chest pain.  Gastrointestinal: Positive for abdominal pain. Negative for constipation, diarrhea, heartburn, nausea and vomiting.  Genitourinary: Negative for flank pain and hematuria.  Musculoskeletal: Negative for falls and joint pain.  Skin: Negative for itching and rash.  Neurological: Negative for seizures and loss of consciousness.  Endo/Heme/Allergies: Negative for polydipsia. Does not bruise/bleed easily.  Psychiatric/Behavioral: Negative for substance abuse. The patient is not nervous/anxious.      Physical Exam: Vital signs: Vitals:   02/22/21 1234 02/22/21 1341  BP: (!) 153/89 (!) 174/78  Pulse: 72 66  Resp: 18   Temp: 98.1 F (36.7 C) 98 F (36.7 C)  SpO2: 98% 100%   Last BM Date: 02/20/21  Physical Exam Vitals reviewed.  Constitutional:      General: He is not in acute distress.    Appearance: He is underweight.  HENT:     Head: Normocephalic and atraumatic.     Nose: Nose normal. No congestion.     Mouth/Throat:     Mouth: Mucous membranes are moist.     Pharynx: Oropharynx is clear.  Eyes:     General: No scleral icterus.    Extraocular Movements: Extraocular movements intact.     Comments: Conjunctival pallor  Cardiovascular:     Rate and Rhythm: Normal rate and regular rhythm.     Pulses: Normal pulses.  Pulmonary:     Effort: Pulmonary effort is normal. No respiratory distress.     Breath sounds: Normal breath sounds.  Abdominal:     General: Bowel sounds are normal. There is no distension.     Palpations: Abdomen is soft. There is no mass.     Tenderness: There is abdominal tenderness  (Epigastric). There is guarding (Voluntary). There is no rebound.     Hernia: No hernia is present.  Musculoskeletal:        General: No tenderness.     Cervical back: Normal range of motion and neck supple.     Right lower leg: Edema present.     Left lower leg: Edema present.  Skin:    General: Skin is warm and dry.     Coloration: Skin is not jaundiced.  Neurological:     General: No focal deficit present.     Mental Status: He is alert and oriented to person, place, and time.  Psychiatric:        Mood and Affect: Mood normal.        Behavior: Behavior normal.      GI:  Lab Results: Recent Labs    02/21/21 0302 02/22/21 0103 02/22/21 1445  WBC 10.5 7.8 6.3  HGB 8.2* 6.8* 8.3*  HCT 25.1* 20.5* 24.7*  PLT 597* 417* 380   BMET Recent Labs    02/20/21 1344 02/21/21 0302 02/22/21 0103  NA 139 140 140  K 3.4* 3.4* 3.7  CL 106 112* 112*  CO2 11* 15* 22  GLUCOSE 97 69* 105*  BUN 19 14 11   CREATININE 1.52* 1.26* 1.01  CALCIUM 8.2* 8.0* 8.0*   LFT Recent Labs    02/21/21 0302 02/22/21 0103  PROT 5.4* 4.4*  ALBUMIN 2.0* 1.6*  AST 14* 16  ALT 14 13  ALKPHOS 148* 111  BILITOT 1.3* 0.8  BILIDIR 0.1  --    PT/INR Recent Labs    02/21/21 0302  LABPROT 15.4*  INR 1.3*     Studies/Results: CT ABDOMEN PELVIS W CONTRAST  Result Date: 02/20/2021 CLINICAL DATA:  Epigastric pain EXAM: CT ABDOMEN AND PELVIS WITH CONTRAST TECHNIQUE: Multidetector CT imaging of the abdomen and pelvis was performed using the standard protocol following bolus administration of intravenous contrast. CONTRAST:  04/22/2021 OMNIPAQUE IOHEXOL 300 MG/ML  SOLN COMPARISON:  02/07/2021 FINDINGS: Lower chest: No acute pleural or parenchymal lung disease. Hepatobiliary: No focal liver abnormality is seen. No gallstones, gallbladder wall thickening, or biliary dilatation. Pancreas: Inflammatory changes are again seen surrounding the pancreas, consistent with acute pancreatitis. The parenchyma enhances  normally. Parenchymal calcifications again noted within the uncinate process. In the interim since prior study, multilocular fluid collections have developed compatible with pseudocysts. The largest is seen extending from the pancreatic head into the porta hepatis measuring 2.6 x 3.1 cm reference image 19/3. Multilocular collections along the ventral aspect of the pancreatic body are also noted, measuring up to 1.9 x 1.9 cm reference image 31/3. Spleen: Normal in size without focal abnormality. Adrenals/Urinary Tract: Stable chronic severe left renal atrophy, with continued dilatation of the left renal pelvis and proximal left ureter. Compensatory hypertrophy of the right kidney, with numerous right renal cysts unchanged. No evidence of urinary tract calculi. The bladder and adrenals are unremarkable. Stomach/Bowel: There is no evidence of bowel obstruction. Scattered gas fluid levels within several loops of jejunum within the left upper quadrant could reflect mild ileus. There is wall thickening of the ascending and proximal transverse colon, likely due to the adjacent inflammatory changes due to acute pancreatitis. The structure within the proximal colonic lumen on prior study has passed in the interim, consistent with stool. No evidence of persistent mass. The appendix is normal. Vascular/Lymphatic: Aortic atherosclerosis. No enlarged abdominal or pelvic lymph nodes. Reproductive: Prostate is unremarkable. Right-sided hydrocele again partially visualized. Other: Trace free fluid within the right paracolic gutter. No free intraperitoneal gas. Small fat containing right inguinal hernia. No bowel herniation. Musculoskeletal: No acute or destructive bony lesions. Reconstructed images demonstrate stable multilevel lumbar spondylosis greatest at L2-3, L4-5, and L5-S1. IMPRESSION: 1. Progression of acute pancreatitis, with interval development of multilocular fluid collections consistent with pseudocysts. 2. Mild wall  thickening of decompressed proximal colon, likely due to the adjacent inflammatory changes due to pancreatitis. 3. Scattered gas fluid levels within loops of jejunum in the left upper quadrant which may reflect mild ileus. No bowel obstruction. 4. Other stable findings throughout the abdomen and pelvis as above. Electronically Signed   By: 21/3 M.D.   On: 02/20/2021 18:14   MR ABDOMEN MRCP W WO CONTAST  Result Date: 02/21/2021 CLINICAL DATA:  Pancreatitis. EXAM: MRI  ABDOMEN WITHOUT AND WITH CONTRAST (INCLUDING MRCP) TECHNIQUE: Multiplanar multisequence MR imaging of the abdomen was performed both before and after the administration of intravenous contrast. Heavily T2-weighted images of the biliary and pancreatic ducts were obtained, and three-dimensional MRCP images were rendered by post processing. CONTRAST:  5.58mL GADAVIST GADOBUTROL 1 MMOL/ML IV SOLN COMPARISON:  CT scan 02/20/2021 FINDINGS: Examination limited by respiratory motion. Lower chest: The lung bases are clear of an acute process. No pulmonary lesions or pleural effusions. Hepatobiliary: No hepatic lesions or intrahepatic biliary dilatation. The gallbladder is mildly distended. No gallstones or gallbladder wall thickening. Normal caliber and course of the common bile duct. No definite common bile duct stones are identified. 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 These are stable. No findings for pancreatic necrosis. No abscess or hematoma. Spleen:  Normal size. No focal lesions. Adrenals/Urinary Tract: Adrenal glands are unremarkable. Small atrophic left kidney. Stable right renal cysts. Stomach/Bowel: The stomach, duodenum, visualized small bowel and visualized colon are grossly normal. Vascular/Lymphatic: The aorta and branch vessels are patent. The major venous structures are patent. Scattered mesenteric and retroperitoneal lymph nodes are unchanged and likely  reactive/inflammatory. Other:  Mesenteric edema and scattered fluid but no overt ascites. Musculoskeletal: No significant bony findings. IMPRESSION: 1. 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 No findings for pancreatic necrosis. 2. Normal caliber and course of the common bile duct. No definite common bile duct stones. 3. Mild gallbladder distention but no definite gallstones. 4. No other acute abdominal findings, mass lesions or adenopathy. Electronically Signed   By: Rudie Meyer M.D.   On: 02/21/2021 16:56    Impression: Acute on chronic anemia: Hemoglobin decreased to 6.8 today as compared to 8.2 yesterday and 9.1 on admission 4/11.  Hemoglobin was 11.1 as of 02/07/2021.  Part of decreased may be delusional, though cannot rule out GI bleeding as source of ongoing drop in hemoglobin.  Pancreatitis: Idiopathic versus possibly related to alcohol, though patient denies any significant alcohol use (1 drink per week).  No gallstones or biliary abnormality on MRI/MRCP. -Normal renal function: BUN 11/creatinine 1.01, improved from admission BUN 19/creatinine 1.52 -Normal LFTs today: T. Bili 0.8/ AST 16/ ALT 13/ ALP 111 (LFTs were slightly elevated upon admission)  Plan: EGD tomorrow due to progressive anemia.  I thoroughly discussed the procedure with the patient to include nature, alternatives, benefits, and risks (including but not limited to bleeding, infection, perforation, anesthesia/cardiac and pulmonary complications).  Patient verbalized understanding and gave verbal consent to proceed with EGD.  Clear liquid diet with NPO after midnight.  Restart IV fluids if patient is amenable, and as tolerated.  Increase Protonix to 40 mg IV twice daily dosing.  [facility] will follow.   LOS: 2 days   Edrick Kins  PA-C 02/22/2021, 3:21 PM  Contact #  [phone]

## 2021-02-23 ENCOUNTER — Inpatient Hospital Stay (HOSPITAL_COMMUNITY): Payer: Medicare HMO | Admitting: Certified Registered"

## 2021-02-23 ENCOUNTER — Encounter (HOSPITAL_COMMUNITY)
Admission: EM | Disposition: A | Discharge status: Home / Self Care | Payer: Self-pay | Source: Home / Self Care | Attending: Internal Medicine

## 2021-02-23 ENCOUNTER — Encounter (HOSPITAL_COMMUNITY): Payer: Self-pay | Admitting: Family Medicine

## 2021-02-23 DIAGNOSIS — K921 Melena: Secondary | ICD-10-CM | POA: Diagnosis present

## 2021-02-23 HISTORY — PX: ESOPHAGOGASTRODUODENOSCOPY: SHX5428

## 2021-02-23 LAB — CBC
HCT: 25.4 % — ABNORMAL LOW (ref 39.0–52.0)
Hemoglobin: 8.7 g/dL — ABNORMAL LOW (ref 13.0–17.0)
MCH: 32.2 pg (ref 26.0–34.0)
MCHC: 34.3 g/dL (ref 30.0–36.0)
MCV: 94.1 fL (ref 80.0–100.0)
Platelets: 410 10*3/uL — ABNORMAL HIGH (ref 150–400)
RBC: 2.7 MIL/uL — ABNORMAL LOW (ref 4.22–5.81)
RDW: 14.6 % (ref 11.5–15.5)
WBC: 7.1 10*3/uL (ref 4.0–10.5)
nRBC: 0 % (ref 0.0–0.2)

## 2021-02-23 LAB — RENAL FUNCTION PANEL
Albumin: 1.8 g/dL — ABNORMAL LOW (ref 3.5–5.0)
Anion gap: 9 (ref 5–15)
BUN: 14 mg/dL (ref 8–23)
CO2: 23 mmol/L (ref 22–32)
Calcium: 7.9 mg/dL — ABNORMAL LOW (ref 8.9–10.3)
Chloride: 108 mmol/L (ref 98–111)
Creatinine, Ser: 0.98 mg/dL (ref 0.61–1.24)
GFR, Estimated: >60 mL/min (ref >60)
Glucose, Bld: 94 mg/dL (ref 70–99)
Phosphorus: 2.5 mg/dL (ref 2.5–4.6)
Potassium: 4.2 mmol/L (ref 3.5–5.1)
Sodium: 140 mmol/L (ref 135–145)

## 2021-02-23 LAB — BPAM RBC
Blood Product Expiration Date: 202205102359
ISSUE DATE / TIME: 202204130941
Unit Type and Rh: 5100

## 2021-02-23 LAB — MAGNESIUM: Magnesium: 2 mg/dL (ref 1.7–2.4)

## 2021-02-23 LAB — TYPE AND SCREEN
ABO/RH(D): O POS
Antibody Screen: NEGATIVE
Unit division: 0

## 2021-02-23 LAB — LIPASE, BLOOD: Lipase: 58 U/L — ABNORMAL HIGH (ref 11–51)

## 2021-02-23 SURGERY — EGD (ESOPHAGOGASTRODUODENOSCOPY)
Anesthesia: Monitor Anesthesia Care

## 2021-02-23 MED ORDER — SODIUM CHLORIDE 0.9 % IV SOLN
INTRAVENOUS | Status: DC
  Administered 2021-02-23: 500 mL via INTRAVENOUS

## 2021-02-23 MED ORDER — LIDOCAINE 2% (20 MG/ML) 5 ML SYRINGE
INTRAMUSCULAR | Status: DC | PRN
  Administered 2021-02-23: 100 mg via INTRAVENOUS

## 2021-02-23 MED ORDER — PROPOFOL 500 MG/50ML IV EMUL
INTRAVENOUS | Status: DC | PRN
  Administered 2021-02-23: 125 ug/kg/min via INTRAVENOUS

## 2021-02-23 MED ORDER — PROPOFOL 10 MG/ML IV BOLUS
INTRAVENOUS | Status: DC | PRN
  Administered 2021-02-23: 100 mg via INTRAVENOUS

## 2021-02-23 MED ORDER — HYDRALAZINE HCL 20 MG/ML IJ SOLN
INTRAMUSCULAR | Status: AC
  Filled 2021-02-23: qty 1

## 2021-02-23 MED ORDER — HYDRALAZINE HCL 20 MG/ML IJ SOLN
10.0000 mg | Freq: Once | INTRAMUSCULAR | Status: AC
  Administered 2021-02-23: 10 mg via INTRAVENOUS

## 2021-02-23 NOTE — Plan of Care (Signed)

## 2021-02-23 NOTE — Op Note (Signed)
[facility] Patient Name: Manuel Castro Procedure Date : 02/23/2021 MRN: [id number] Attending MD: [name] , MD Date of Birth: [birth year] CSN: [id number] Age: 70 Admit Type: Inpatient Procedure:                Upper GI endoscopy Indications:              Suspected upper gastrointestinal bleeding, Acute                            post hemorrhagic anemia, Melena Providers:                [name], MD, Rogue Jury, RN, Arlee Muslim Tech., Technician, Rosiland Oz, CRNA Referring MD:             hospital team Medicines:                Propofol per Anesthesia, Monitored Anesthesia Care Complications:            No immediate complications. Estimated Blood Loss:     Estimated blood loss: none. Procedure:                Pre-Anesthesia Assessment:                           - Prior to the procedure, a History and Physical                            was performed, and patient medications and                            allergies were reviewed. The patient's tolerance of                            previous anesthesia was also reviewed. The risks                            and benefits of the procedure and the sedation                            options and risks were discussed with the patient.                            All questions were answered, and informed consent                            was obtained. Prior Anticoagulants: The patient has                            taken no previous anticoagulant or antiplatelet                            agents. ASA Grade Assessment: III - A patient with  severe systemic disease. After reviewing the risks                            and benefits, the patient was deemed in                            satisfactory condition to undergo the procedure.                           After obtaining informed consent, the endoscope was                            passed under  direct vision. Throughout the                            procedure, the patient's blood pressure, pulse, and                            oxygen saturations were monitored continuously. The                            [id number]-H190 ([id number]) Olympus gastroscope was                            introduced through the mouth, and advanced to the                            second part of duodenum. The upper GI endoscopy was                            accomplished without difficulty. The patient                            tolerated the procedure well. Scope In: Scope Out: Findings:      The examined esophagus was normal.      The Z-line was regular and was found 40 cm from the incisors.      Segmental mild inflammation characterized by congestion (edema) and       linear erosions was found in the gastric antrum.      A single medium sessile polyp with no bleeding and no stigmata of recent       bleeding was found in the gastric fundus.      The cardia and gastric fundus were normal on retroflexion.      The examined duodenum was normal. Impression:               - Normal esophagus.                           - Z-line regular, 40 cm from the incisors.                           - Acute gastritis.                           - A single gastric polyp (  benign-appearing fundic                            gland polyp).                           - Normal examined duodenum.                           - No specimens collected. Recommendation:           - Clear liquid diet.                           - Observe patient's clinical course. Procedure Code(s):        --- Professional ---                           [id number], Esophagogastroduodenoscopy, flexible,                            transoral; diagnostic, including collection of                            specimen(s) by brushing or washing, when performed                            (separate procedure) Diagnosis Code(s):        --- Professional ---                            K92.1, Melena (includes Hematochezia)                           D62, Acute posthemorrhagic anemia                           K29.00, Acute gastritis without bleeding                           K31.7, Polyp of stomach and duodenum CPT copyright 2019 [employer]. All rights reserved. The codes documented in this report are preliminary and upon coder review may  be revised to meet current compliance requirements. [name], MD 02/23/2021 2:46:02 PM This report has been signed electronically. Number of Addenda: 0

## 2021-02-23 NOTE — Progress Notes (Signed)
PROGRESS NOTE    Manuel ReapHarry Castro  ZOX:[id number] DOB: 09/05/1951 DOA: 02/20/2021 PCP: Margot Ableskwubunka-Anyim, Ahunna, MD    Brief Narrative:  70 y/o male admitted with acute pancreatitis. Started on bowel rest, IV fluids and pain management. Noted to have significant decline in hemoglobin and was transfused 1 prbc on 4/13. He did describe dark stools prior to admission. GI consulted.   Assessment & Plan:   Principal Problem:   Acute pancreatitis Active Problems:   Nausea & vomiting   Anemia   Leukocytosis   AKI (acute kidney injury) (HCC)   Hypokalemia   HTN (hypertension)   Protein-calorie malnutrition, severe   Charcot-Marie-Tooth disease   Acute pancreatitis -Admitted with lipase of 712 -CT abdomen confirmed pancreatitis with interval development of multilocular fluid collections consistent with pseudocysts -started on bowe rest and IV fluids -continue pain management -denies any recent alcohol use -MRCP did not show any bile duct abnormalities -will try and clear liquids today after endoscopy  Anemia -baseline hgb of 10 -admission hgb 9.1 -likely has a dilutional component -may have had some GI blood loss since he describes dark stools prior to admission -Hgb down to 6.8 on 4/13 s/p 1 unit prbc -follow up hemoglobin improved to 8.3 and has been stable -He is on protonix -seen by GI with plans for EGD today -continue to follow hemoglobin  HTN -he was previously on amlodipine, but reports stopping this some time back -blood pressure likely elevated in the setting of pain -continue to follow and use hydralazine prn  Hypokalemia -replaced  AKI -creatinine of 1.5 on admission -improved with IV hydration back to normal range  Charcot-Marie-Tooth disease -patient says he normally ambulates with bilateral leg braces and a cane  Severe protein calorie malnutrition -likely impacted by poor po intake over the last 10 days prior to admission -nutrition consult   DVT  prophylaxis: SCDs Start: 02/21/21 0130  Code Status: Full code Family Communication: Discussed with patient Disposition Plan: Status is: Inpatient  Remains inpatient appropriate because:Ongoing active pain requiring inpatient pain management   Dispo: The patient is from: Home              Anticipated d/c is to: Home              Patient currently is not medically stable to d/c.   Difficult to place patient No    Consultants:   [facility]  Procedures:     Antimicrobials:       Subjective: Reports 4-5 black, tarry stools overnight. No vomiting. Abdominal pain is about the same as yesterday  Objective: Vitals:   02/22/21 1013 02/22/21 1234 02/22/21 1341 02/22/21 2023  BP: (!) 166/75 (!) 153/89 (!) 174/78 (!) 176/79  Pulse: 71 72 66 65  Resp: 18 18 18 18   Temp: 98.1 F (36.7 C) 98.1 F (36.7 C) 98 F (36.7 C) 98.2 F (36.8 C)  TempSrc: Oral Oral Oral Oral  SpO2: 98% 98% 100% 99%  Weight:      Height:        Intake/Output Summary (Last 24 hours) at 02/23/2021 1038 Last data filed at 02/23/2021 0715 Gross per 24 hour  Intake 1783.55 ml  Output 500 ml  Net 1283.55 ml   Filed Weights   02/20/21 2105 02/20/21 2329 02/22/21 0606  Weight: 58.2 kg 56 kg 56 kg    Examination:  General exam: Alert, awake, oriented x 3 Respiratory system: Clear to auscultation. Respiratory effort normal. Cardiovascular system:RRR. No murmurs, rubs, gallops. Gastrointestinal system:  Abdomen is nondistended, soft and nontender. No organomegaly or masses felt. Normal bowel sounds heard. Central nervous system: Alert and oriented. No focal neurological deficits. Extremities: No C/C/E, +pedal pulses Skin: No rashes, lesions or ulcers Psychiatry: Judgement and insight appear normal. Mood & affect appropriate.      Data Reviewed: I have personally reviewed following labs and imaging studies  CBC: Recent Labs  Lab 02/20/21 1344 02/21/21 0302 02/22/21 0103 02/22/21 1445  02/23/21 0317  WBC 14.4* 10.5 7.8 6.3 7.1  HGB 9.1* 8.2* 6.8* 8.3* 8.7*  HCT 29.0* 25.1* 20.5* 24.7* 25.4*  MCV 99.0 96.5 95.3 93.9 94.1  PLT 729* 597* 417* 380 410*   Basic Metabolic Panel: Recent Labs  Lab 02/20/21 1344 02/21/21 0302 02/22/21 0103 02/23/21 0317  NA 139 140 140 140  K 3.4* 3.4* 3.7 4.2  CL 106 112* 112* 108  CO2 11* 15* 22 23  GLUCOSE 97 69* 105* 94  BUN 19 14 11 14   CREATININE 1.52* 1.26* 1.01 0.98  CALCIUM 8.2* 8.0* 8.0* 7.9*  MG  --  1.6*  --  2.0  PHOS  --  2.2*  --  2.5   GFR: Estimated Creatinine Clearance: 56.3 mL/min (by C-G formula based on SCr of 0.98 mg/dL). Liver Function Tests: Recent Labs  Lab 02/20/21 1344 02/21/21 0302 02/22/21 0103 02/23/21 0317  AST 14* 14* 16  --   ALT 16 14 13   --   ALKPHOS 168* 148* 111  --   BILITOT 2.6* 1.3* 0.8  --   PROT 5.7* 5.4* 4.4*  --   ALBUMIN 2.1* 2.0* 1.6* 1.8*   Recent Labs  Lab 02/20/21 1344  LIPASE 712*   No results for input(s): AMMONIA in the last 168 hours. Coagulation Profile: Recent Labs  Lab 02/21/21 0302  INR 1.3*   Cardiac Enzymes: No results for input(s): CKTOTAL, CKMB, CKMBINDEX, TROPONINI in the last 168 hours. BNP (last 3 results) No results for input(s): PROBNP in the last 8760 hours. HbA1C: No results for input(s): HGBA1C in the last 72 hours. CBG: No results for input(s): GLUCAP in the last 168 hours. Lipid Profile: Recent Labs    02/21/21 0302  TRIG 168*   Thyroid Function Tests: No results for input(s): TSH, T4TOTAL, FREET4, T3FREE, THYROIDAB in the last 72 hours. Anemia Panel: Recent Labs    02/21/21 0302  FOLATE 23.1  FERRITIN 435*  TIBC 118*  IRON 25*  RETICCTPCT 1.6   Sepsis Labs: No results for input(s): PROCALCITON, LATICACIDVEN in the last 168 hours.  Recent Results (from the past 240 hour(s))  Resp Panel by RT-PCR (Flu A&B, Covid) Nasopharyngeal Swab     Status: None   Collection Time: 02/20/21  9:13 PM   Specimen: Nasopharyngeal Swab;  Nasopharyngeal(NP) swabs in vial transport medium  Result Value Ref Range Status   SARS Coronavirus 2 by RT PCR NEGATIVE NEGATIVE Final    Comment: (NOTE) SARS-CoV-2 target nucleic acids are NOT DETECTED.  The SARS-CoV-2 RNA is generally detectable in upper respiratory specimens during the acute phase of infection. The lowest concentration of SARS-CoV-2 viral copies this assay can detect is 138 copies/mL. A negative result does not preclude SARS-Cov-2 infection and should not be used as the sole basis for treatment or other patient management decisions. A negative result may occur with  improper specimen collection/handling, submission of specimen other than nasopharyngeal swab, presence of viral mutation(s) within the areas targeted by this assay, and inadequate number of viral copies(<138 copies/mL). A negative result  must be combined with clinical observations, patient history, and epidemiological information. The expected result is Negative.  Fact Sheet for Patients:  BloggerCourse.com  Fact Sheet for Healthcare Providers:  SeriousBroker.it  This test is no t yet approved or cleared by the [place] FDA and  has been authorized for detection and/or diagnosis of SARS-CoV-2 by FDA under an Emergency Use Authorization (EUA). This EUA will remain  in effect (meaning this test can be used) for the duration of the COVID-19 declaration under Section 564(b)(1) of the Act, 21 U.S.C.section 360bbb-3(b)(1), unless the authorization is terminated  or revoked sooner.       Influenza A by PCR NEGATIVE NEGATIVE Final   Influenza B by PCR NEGATIVE NEGATIVE Final    Comment: (NOTE) The Xpert Xpress SARS-CoV-2/FLU/RSV plus assay is intended as an aid in the diagnosis of influenza from Nasopharyngeal swab specimens and should not be used as a sole basis for treatment. Nasal washings and aspirates are unacceptable for Xpert Xpress  SARS-CoV-2/FLU/RSV testing.  Fact Sheet for Patients: BloggerCourse.com  Fact Sheet for Healthcare Providers: SeriousBroker.it  This test is not yet approved or cleared by the [place] FDA and has been authorized for detection and/or diagnosis of SARS-CoV-2 by FDA under an Emergency Use Authorization (EUA). This EUA will remain in effect (meaning this test can be used) for the duration of the COVID-19 declaration under Section 564(b)(1) of the Act, 21 U.S.C. section 360bbb-3(b)(1), unless the authorization is terminated or revoked.  Performed at [facility]          Radiology Studies: MR ABDOMEN MRCP W WO CONTAST  Result Date: 02/21/2021 CLINICAL DATA:  Pancreatitis. EXAM: MRI ABDOMEN WITHOUT AND WITH CONTRAST (INCLUDING MRCP) TECHNIQUE: Multiplanar multisequence MR imaging of the abdomen was performed both before and after the administration of intravenous contrast. Heavily T2-weighted images of the biliary and pancreatic ducts were obtained, and three-dimensional MRCP images were rendered by post processing. CONTRAST:  5.81mL GADAVIST GADOBUTROL 1 MMOL/ML IV SOLN COMPARISON:  CT scan 02/20/2021 FINDINGS: Examination limited by respiratory motion. Lower chest: The lung bases are clear of an acute process. No pulmonary lesions or pleural effusions. Hepatobiliary: No hepatic lesions or intrahepatic biliary dilatation. The gallbladder is mildly distended. No gallstones or gallbladder wall thickening. Normal caliber and course of the common bile duct. No definite common bile duct stones are identified. 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 These are stable. No findings for pancreatic necrosis. No abscess or hematoma. Spleen:  Normal size. No focal lesions. Adrenals/Urinary Tract: Adrenal glands are unremarkable. Small atrophic left kidney. Stable right renal cysts.  Stomach/Bowel: The stomach, duodenum, visualized small bowel and visualized colon are grossly normal. Vascular/Lymphatic: The aorta and branch vessels are patent. The major venous structures are patent. Scattered mesenteric and retroperitoneal lymph nodes are unchanged and likely reactive/inflammatory. Other:  Mesenteric edema and scattered fluid but no overt ascites. Musculoskeletal: No significant bony findings. IMPRESSION: 1. 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 No findings for pancreatic necrosis. 2. Normal caliber and course of the common bile duct. No definite common bile duct stones. 3. Mild gallbladder distention but no definite gallstones. 4. No other acute abdominal findings, mass lesions or adenopathy. Electronically Signed   By: Rudie Meyer M.D.   On: 02/21/2021 16:56        Scheduled Meds: . sodium chloride   Intravenous Once  . dorzolamide-timolol  1 drop Both Eyes BID  . pantoprazole (  PROTONIX) IV  40 mg Intravenous Q12H   Continuous Infusions:    LOS: 3 days    Time spent: 35 mins    Erick Blinks, MD [facility]   If 7PM-7AM, please contact night-coverage [email or web address]  02/23/2021, 10:38 AM

## 2021-02-23 NOTE — Interval H&P Note (Signed)
History and Physical Interval Note:  02/23/2021 2:30 PM  Manuel Castro  has presented today for surgery, with the diagnosis of anemia.  The various methods of treatment have been discussed with the patient and family. After consideration of risks, benefits and other options for treatment, the patient has consented to  Procedure(s): ESOPHAGOGASTRODUODENOSCOPY (EGD) (N/A) as a surgical intervention.  The patient's history has been reviewed, patient examined, no change in status, stable for surgery.  I have reviewed the patient's chart and labs.  Questions were answered to the patient's satisfaction.     [name]

## 2021-02-23 NOTE — Anesthesia Postprocedure Evaluation (Signed)
Anesthesia Post Note  Patient: Aeric Burnham  Procedure(s) Performed: ESOPHAGOGASTRODUODENOSCOPY (EGD) (N/A )     Patient location during evaluation: Endoscopy Anesthesia Type: MAC Level of consciousness: awake and alert Pain management: pain level controlled Vital Signs Assessment: post-procedure vital signs reviewed and stable Respiratory status: spontaneous breathing, nonlabored ventilation and respiratory function stable Cardiovascular status: blood pressure returned to baseline and stable Postop Assessment: no apparent nausea or vomiting Anesthetic complications: no   No complications documented.  Last Vitals:  Vitals:   02/23/21 1447 02/23/21 1457  BP: (!) 147/88 (!) 169/63  Pulse: 82 71  Resp: 14 15  Temp: 36.6 C   SpO2: 100% 99%    Last Pain:  Vitals:   02/23/21 1457  TempSrc:   PainSc: 0-No pain                 Lidia Collum

## 2021-02-23 NOTE — Brief Op Note (Signed)
Mild antral gastritis. No source of anemia seen on EGD. Patient reports a colonoscopy 6 years ago in [place], [place] that was normal (records not available). No further GI workup at this time unless anemia recurs. I do not think he could tolerate a colon prep right now with his pancreatitis and would wait for that to resolve and do a repeat colonoscopy and possible capsule endoscopy as an outpt. Clear liquid diet and if tolerates ok to slowly advance tomorrow. Supportive care. [facility] will follow at a distance. Dr. Ewing Schlein available to see tomorrow and this weekend if necessary. Call if questions.

## 2021-02-23 NOTE — Anesthesia Preprocedure Evaluation (Signed)
Anesthesia Evaluation  Patient identified by MRN, date of birth, ID band Patient awake    Reviewed: Allergy & Precautions, NPO status , Patient's Chart, lab work & pertinent test results  History of Anesthesia Complications Negative for: history of anesthetic complications  Airway Mallampati: II  TM Distance: >3 FB Neck ROM: Full    Dental  (+) Caps, Teeth Intact   Pulmonary Current Smoker,    Pulmonary exam normal        Cardiovascular hypertension, Normal cardiovascular exam     Neuro/Psych Depression negative neurological ROS     GI/Hepatic Neg liver ROS, GERD  ,Acute pancreatitis   Endo/Other  negative endocrine ROS  Renal/GU Renal InsufficiencyRenal disease  negative genitourinary   Musculoskeletal negative musculoskeletal ROS (+)   Abdominal   Peds  Hematology  (+) anemia ,   Anesthesia Other Findings   Reproductive/Obstetrics                             Anesthesia Physical Anesthesia Plan  ASA: III  Anesthesia Plan: MAC   Post-op Pain Management:    Induction: Intravenous  PONV Risk Score and Plan: 1 and Propofol infusion, TIVA and Treatment may vary due to age or medical condition  Airway Management Planned: Natural Airway, Nasal Cannula and Simple Face Mask  Additional Equipment: None  Intra-op Plan:   Post-operative Plan:   Informed Consent: I have reviewed the patients History and Physical, chart, labs and discussed the procedure including the risks, benefits and alternatives for the proposed anesthesia with the patient or authorized representative who has indicated his/her understanding and acceptance.       Plan Discussed with:   Anesthesia Plan Comments:         Anesthesia Quick Evaluation

## 2021-02-23 NOTE — Transfer of Care (Signed)
Immediate Anesthesia Transfer of Care Note  Patient: [place]  Procedure(s) Performed: ESOPHAGOGASTRODUODENOSCOPY (EGD) (N/A )  Patient Location: Endoscopy Unit  Anesthesia Type:MAC  Level of Consciousness: drowsy and patient cooperative  Airway & Oxygen Therapy: Patient Spontanous Breathing  Post-op Assessment: Report given to RN and Post -op Vital signs reviewed and stable  Post vital signs: Reviewed and stable  Last Vitals:  Vitals Value Taken Time  BP 147/88 02/23/21 1446  Temp    Pulse 82 02/23/21 1447  Resp 14 02/23/21 1447  SpO2 100 % 02/23/21 1447  Vitals shown include unvalidated device data.  Last Pain:  Vitals:   02/23/21 1348  TempSrc: Temporal  PainSc: 7       Patients Stated Pain Goal: 2 (20/10/07 1219)  Complications: No complications documented.

## 2021-02-24 ENCOUNTER — Encounter (HOSPITAL_COMMUNITY): Payer: Self-pay | Admitting: Gastroenterology

## 2021-02-24 LAB — RENAL FUNCTION PANEL
Albumin: 1.8 g/dL — ABNORMAL LOW (ref 3.5–5.0)
Anion gap: 10 (ref 5–15)
BUN: 12 mg/dL (ref 8–23)
CO2: 19 mmol/L — ABNORMAL LOW (ref 22–32)
Calcium: 7.7 mg/dL — ABNORMAL LOW (ref 8.9–10.3)
Chloride: 109 mmol/L (ref 98–111)
Creatinine, Ser: 0.94 mg/dL (ref 0.61–1.24)
GFR, Estimated: >60 mL/min (ref >60)
Glucose, Bld: 76 mg/dL (ref 70–99)
Phosphorus: 2.8 mg/dL (ref 2.5–4.6)
Potassium: 3.1 mmol/L — ABNORMAL LOW (ref 3.5–5.1)
Sodium: 138 mmol/L (ref 135–145)

## 2021-02-24 LAB — CBC
HCT: 25.6 % — ABNORMAL LOW (ref 39.0–52.0)
Hemoglobin: 8.4 g/dL — ABNORMAL LOW (ref 13.0–17.0)
MCH: 31.1 pg (ref 26.0–34.0)
MCHC: 32.8 g/dL (ref 30.0–36.0)
MCV: 94.8 fL (ref 80.0–100.0)
Platelets: 428 10*3/uL — ABNORMAL HIGH (ref 150–400)
RBC: 2.7 MIL/uL — ABNORMAL LOW (ref 4.22–5.81)
RDW: 14.7 % (ref 11.5–15.5)
WBC: 7 10*3/uL (ref 4.0–10.5)
nRBC: 0 % (ref 0.0–0.2)

## 2021-02-24 MED ORDER — GABAPENTIN 600 MG PO TABS
600.0000 mg | ORAL_TABLET | Freq: Three times a day (TID) | ORAL | Status: DC
  Administered 2021-02-24 – 2021-02-25 (×5): 600 mg via ORAL
  Filled 2021-02-24 (×5): qty 1

## 2021-02-24 MED ORDER — PANTOPRAZOLE SODIUM 40 MG IV SOLR
40.0000 mg | INTRAVENOUS | Status: DC
  Administered 2021-02-25: 40 mg via INTRAVENOUS
  Filled 2021-02-24: qty 40

## 2021-02-24 MED ORDER — AMLODIPINE BESYLATE 5 MG PO TABS
5.0000 mg | ORAL_TABLET | Freq: Every day | ORAL | Status: DC
  Administered 2021-02-24 – 2021-02-25 (×2): 5 mg via ORAL
  Filled 2021-02-24 (×2): qty 1

## 2021-02-24 MED ORDER — POTASSIUM CHLORIDE 20 MEQ PO PACK
40.0000 meq | PACK | ORAL | Status: AC
  Administered 2021-02-24 (×2): 40 meq via ORAL
  Filled 2021-02-24: qty 2

## 2021-02-24 MED ORDER — TRAZODONE HCL 100 MG PO TABS
100.0000 mg | ORAL_TABLET | Freq: Every day | ORAL | Status: DC
  Administered 2021-02-24: 100 mg via ORAL
  Filled 2021-02-24: qty 1

## 2021-02-24 MED ORDER — HYDROCORTISONE ACETATE 25 MG RE SUPP
25.0000 mg | Freq: Two times a day (BID) | RECTAL | Status: DC
  Administered 2021-02-24: 25 mg via RECTAL
  Filled 2021-02-24 (×3): qty 1

## 2021-02-24 MED ORDER — TERAZOSIN HCL 5 MG PO CAPS
5.0000 mg | ORAL_CAPSULE | Freq: Every day | ORAL | Status: DC
  Administered 2021-02-24: 5 mg via ORAL
  Filled 2021-02-24 (×2): qty 1

## 2021-02-24 MED ORDER — OXYCODONE-ACETAMINOPHEN 5-325 MG PO TABS
1.0000 | ORAL_TABLET | ORAL | Status: DC | PRN
Start: 2021-02-24 — End: 2021-02-26
  Administered 2021-02-24 – 2021-02-25 (×2): 2 via ORAL
  Filled 2021-02-24 (×2): qty 2

## 2021-02-24 NOTE — Progress Notes (Signed)
PROGRESS NOTE    Ramirez Fullbright  VZD:[id number] DOB: 1951-06-14 DOA: 02/20/2021 PCP: Margot Ables, MD    Brief Narrative:  70 y/o male admitted with acute pancreatitis. Started on bowel rest, IV fluids and pain management. Noted to have significant decline in hemoglobin and was transfused 1 prbc on 4/13. He did describe dark stools prior to admission. GI consulted.   Assessment & Plan:   Principal Problem:   Acute pancreatitis Active Problems:   Nausea & vomiting   Anemia   Leukocytosis   AKI (acute kidney injury) (HCC)   Hypokalemia   HTN (hypertension)   Protein-calorie malnutrition, severe   Charcot-Marie-Tooth disease   Melena   Acute pancreatitis -Admitted with lipase of 712 -CT abdomen confirmed pancreatitis with interval development of multilocular fluid collections consistent with pseudocysts -started on bowe rest and IV fluids -continue pain management -denies any recent alcohol use -MRCP did not show any bile duct abnormalities -Appears to be tolerating clear liquids, will advance to heart healthy  Anemia -baseline hgb of 10 -admission hgb 9.1 -likely has a dilutional component -may have had some GI blood loss since he describes dark stools prior to admission -Hgb down to 6.8 on 4/13 s/p 1 unit prbc -follow up hemoglobin improved to 8.3 and has been stable -He is on protonix -seen by GI and he underwent EGD on 4/14 that showed mild antral gastritis -Plans are for further outpatient work-up with colonoscopy and possible capsule endoscopy.  No further inpatient work-up planned unless hemoglobin begins to drop again -continue to follow hemoglobin  HTN -he was previously on amlodipine, but reports stopping this some time back -Blood pressures remain elevated -We will restart on Norvasc -continue to follow and use hydralazine prn  Hypokalemia -replace  AKI -creatinine of 1.5 on admission -improved with IV hydration back to normal  range  Charcot-Marie-Tooth disease -patient says he normally ambulates with bilateral leg braces and a cane  Severe protein calorie malnutrition -likely impacted by poor po intake over the last 10 days prior to admission -nutrition following   DVT prophylaxis: SCDs Start: 02/21/21 0130  Code Status: Full code Family Communication: Discussed with patient Disposition Plan: Status is: Inpatient  Remains inpatient appropriate because:Ongoing active pain requiring inpatient pain management   Dispo: The patient is from: Home              Anticipated d/c is to: Home              Patient currently is not medically stable to d/c.   Difficult to place patient No    Consultants:   [facility]  Procedures:   EGD: Mild antral gastritis, no obvious source of bleeding  Antimicrobials:       Subjective: Reports having very small amounts of stool that are still dark in color.  No vomiting.  Objective: Vitals:   02/23/21 1735 02/23/21 2024 02/24/21 0429 02/24/21 1238  BP: (!) 163/80 (!) 152/79 (!) 167/81 (!) 188/94  Pulse: 69 68 72 71  Resp: 19 19 18    Temp: 98 F (36.7 C) 97.8 F (36.6 C) 98.2 F (36.8 C)   TempSrc: Oral Oral    SpO2: 100% 100% 100% 99%  Weight:      Height:        Intake/Output Summary (Last 24 hours) at 02/24/2021 1240 Last data filed at 02/23/2021 1736 Gross per 24 hour  Intake 440 ml  Output --  Net 440 ml   Filed Weights   02/20/21 2105  02/20/21 2329 02/22/21 0606  Weight: 58.2 kg 56 kg 56 kg    Examination:  General exam: Alert, awake, oriented x 3 Respiratory system: Clear to auscultation. Respiratory effort normal. Cardiovascular system:RRR. No murmurs, rubs, gallops. Gastrointestinal system: Abdomen is nondistended, soft and less tender today as compared to yesterday. No organomegaly or masses felt. Normal bowel sounds heard. Central nervous system: Alert and oriented. No focal neurological deficits. Extremities: No C/C/E, +pedal  pulses Skin: No rashes, lesions or ulcers Psychiatry: Judgement and insight appear normal. Mood & affect appropriate.      Data Reviewed: I have personally reviewed following labs and imaging studies  CBC: Recent Labs  Lab 02/21/21 0302 02/22/21 0103 02/22/21 1445 02/23/21 0317 02/24/21 0045  WBC 10.5 7.8 6.3 7.1 7.0  HGB 8.2* 6.8* 8.3* 8.7* 8.4*  HCT 25.1* 20.5* 24.7* 25.4* 25.6*  MCV 96.5 95.3 93.9 94.1 94.8  PLT 597* 417* 380 410* 428*   Basic Metabolic Panel: Recent Labs  Lab 02/20/21 1344 02/21/21 0302 02/22/21 0103 02/23/21 0317 02/24/21 0045  NA 139 140 140 140 138  K 3.4* 3.4* 3.7 4.2 3.1*  CL 106 112* 112* 108 109  CO2 11* 15* 22 23 19*  GLUCOSE 97 69* 105* 94 76  BUN 19 14 11 14 12   CREATININE 1.52* 1.26* 1.01 0.98 0.94  CALCIUM 8.2* 8.0* 8.0* 7.9* 7.7*  MG  --  1.6*  --  2.0  --   PHOS  --  2.2*  --  2.5 2.8   GFR: Estimated Creatinine Clearance: 58.7 mL/min (by C-G formula based on SCr of 0.94 mg/dL). Liver Function Tests: Recent Labs  Lab 02/20/21 1344 02/21/21 0302 02/22/21 0103 02/23/21 0317 02/24/21 0045  AST 14* 14* 16  --   --   ALT 16 14 13   --   --   ALKPHOS 168* 148* 111  --   --   BILITOT 2.6* 1.3* 0.8  --   --   PROT 5.7* 5.4* 4.4*  --   --   ALBUMIN 2.1* 2.0* 1.6* 1.8* 1.8*   Recent Labs  Lab 02/20/21 1344 02/23/21 0317  LIPASE 712* 58*   No results for input(s): AMMONIA in the last 168 hours. Coagulation Profile: Recent Labs  Lab 02/21/21 0302  INR 1.3*   Cardiac Enzymes: No results for input(s): CKTOTAL, CKMB, CKMBINDEX, TROPONINI in the last 168 hours. BNP (last 3 results) No results for input(s): PROBNP in the last 8760 hours. HbA1C: No results for input(s): HGBA1C in the last 72 hours. CBG: No results for input(s): GLUCAP in the last 168 hours. Lipid Profile: No results for input(s): CHOL, HDL, LDLCALC, TRIG, CHOLHDL, LDLDIRECT in the last 72 hours. Thyroid Function Tests: No results for input(s): TSH,  T4TOTAL, FREET4, T3FREE, THYROIDAB in the last 72 hours. Anemia Panel: No results for input(s): VITAMINB12, FOLATE, FERRITIN, TIBC, IRON, RETICCTPCT in the last 72 hours. Sepsis Labs: No results for input(s): PROCALCITON, LATICACIDVEN in the last 168 hours.  Recent Results (from the past 240 hour(s))  Resp Panel by RT-PCR (Flu A&B, Covid) Nasopharyngeal Swab     Status: None   Collection Time: 02/20/21  9:13 PM   Specimen: Nasopharyngeal Swab; Nasopharyngeal(NP) swabs in vial transport medium  Result Value Ref Range Status   SARS Coronavirus 2 by RT PCR NEGATIVE NEGATIVE Final    Comment: (NOTE) SARS-CoV-2 target nucleic acids are NOT DETECTED.  The SARS-CoV-2 RNA is generally detectable in upper respiratory specimens during the acute phase of infection. The  lowest concentration of SARS-CoV-2 viral copies this assay can detect is 138 copies/mL. A negative result does not preclude SARS-Cov-2 infection and should not be used as the sole basis for treatment or other patient management decisions. A negative result may occur with  improper specimen collection/handling, submission of specimen other than nasopharyngeal swab, presence of viral mutation(s) within the areas targeted by this assay, and inadequate number of viral copies(<138 copies/mL). A negative result must be combined with clinical observations, patient history, and epidemiological information. The expected result is Negative.  Fact Sheet for Patients:  BloggerCourse.com  Fact Sheet for Healthcare Providers:  SeriousBroker.it  This test is no t yet approved or cleared by the [place] FDA and  has been authorized for detection and/or diagnosis of SARS-CoV-2 by FDA under an Emergency Use Authorization (EUA). This EUA will remain  in effect (meaning this test can be used) for the duration of the COVID-19 declaration under Section 564(b)(1) of the Act, 21 U.S.C.section  360bbb-3(b)(1), unless the authorization is terminated  or revoked sooner.       Influenza A by PCR NEGATIVE NEGATIVE Final   Influenza B by PCR NEGATIVE NEGATIVE Final    Comment: (NOTE) The Xpert Xpress SARS-CoV-2/FLU/RSV plus assay is intended as an aid in the diagnosis of influenza from Nasopharyngeal swab specimens and should not be used as a sole basis for treatment. Nasal washings and aspirates are unacceptable for Xpert Xpress SARS-CoV-2/FLU/RSV testing.  Fact Sheet for Patients: BloggerCourse.com  Fact Sheet for Healthcare Providers: SeriousBroker.it  This test is not yet approved or cleared by the [place] FDA and has been authorized for detection and/or diagnosis of SARS-CoV-2 by FDA under an Emergency Use Authorization (EUA). This EUA will remain in effect (meaning this test can be used) for the duration of the COVID-19 declaration under Section 564(b)(1) of the Act, 21 U.S.C. section 360bbb-3(b)(1), unless the authorization is terminated or revoked.  Performed at [facility]          Radiology Studies: No results found.      Scheduled Meds: . sodium chloride   Intravenous Once  . dorzolamide-timolol  1 drop Both Eyes BID  . gabapentin  600 mg Oral TID  . hydrocortisone  25 mg Rectal BID  . [START ON 02/25/2021] pantoprazole (PROTONIX) IV  40 mg Intravenous Q24H  . potassium chloride  40 mEq Oral Q4H  . terazosin  5 mg Oral QHS  . traZODone  100 mg Oral QHS   Continuous Infusions:    LOS: 4 days    Time spent: 35 mins    Erick Blinks, MD [facility]   If 7PM-7AM, please contact night-coverage [email or web address]  02/24/2021, 12:40 PM

## 2021-02-24 NOTE — Care Management Important Message (Signed)
Important Message  Patient Details  Name: Manuel Castro MRN: [id number] Date of Birth: [birth year]   Medicare Important Message Given:  Yes - Important Message mailed due to current National Emergency   Verbal consent obtained due to current National Emergency  Relationship to patient: Self Contact Name: Dhruv Call Date: 02/24/21  Time: 1418 Phone: [phone] Outcome: No Answer/Busy Important Message mailed to: Patient address on file    Orson Aloe 02/24/2021, 2:18 PM

## 2021-02-24 NOTE — Progress Notes (Signed)
Nutrition Follow-up  DOCUMENTATION CODES:   Severe malnutrition in context of acute illness/injury  INTERVENTION:   If pt does not tolerate diet advancement and/or po intake remains inadequate, recommend insertion of NG/Cortrak tube on with initiation of TF given malnutrition diagnosis  Boost Breeze po TID, each supplement provides 250 kcal and 9 grams of protein  30 ml ProSource Plus BID, each supplement provides 100 kcals and 15 grams protein.   Continue MVI with Minerals, Thiamine  If TF initiated, recommend Vital AF 1.2 at 70 ml/hr providing 126 g of protein, 2016 kcals   NUTRITION DIAGNOSIS:   Severe Malnutrition related to acute illness (acute pancreatitis, EtOH) as evidenced by energy intake < or equal to 50% for > or equal to 5 days,moderate fat depletion,severe fat depletion.  Being addressed via diet advancement, supplement  GOAL:   Patient will meet greater than or equal to 90% of their needs  Progressing  MONITOR:   Supplement acceptance,Diet advancement,Labs,Weight trends  REASON FOR ASSESSMENT:   Malnutrition Screening Tool    ASSESSMENT:   70 yo male admitted with acute pancreatitis. Pt originally presented with complaints of abdominal pain 02/07/21 and discharged home for conservative management.  PMH includes GERD, HTN  CT abdomen confirmed pancreatitis with pseudocysts MRCP with no bile duct abnormalities  4/14 EGD with mild antral gastritis, no other abnormalities  Tolerated CL diet, discussed advancement with MD. Initially planned for FL diet but pt does not eat any dairy therefore decision made to advance to LF diet  Pt denies nausea, vomiting. Complains of abdominal discomfort, "tightness" this morning after breakfast.   Pt made aware of diet advancement and suggested "good options" from the hospital menu provided to the patient.  Pt only wanting chicken noodle soup with apple juice for lunch but not much else  Labs: lipase down to 58,  potassium 3.1 (L) Meds: KCL, pain regimen  Diet Order:   Diet Order            Diet Heart Room service appropriate? Yes; Fluid consistency: Thin  Diet effective now                 EDUCATION NEEDS:   Education needs have been addressed  Skin:  Skin Assessment: Reviewed RN Assessment  Last BM:  4/14  Height:   Ht Readings from Last 1 Encounters:  02/20/21 5\' 8"  (1.727 m)    Weight:   Wt Readings from Last 1 Encounters:  02/22/21 56 kg    BMI:  Body mass index is 18.77 kg/m.  Estimated Nutritional Needs:   Kcal:  1950-2250 kcals  Protein:  95-115 g  Fluid:  >/= 2 L  02/24/21 MS, RDN, LDN, CNSC Registered Dietitian III Clinical Nutrition RD Pager and On-Call Pager Number Located in [place]

## 2021-02-25 LAB — BASIC METABOLIC PANEL
Anion gap: 7 (ref 5–15)
BUN: 8 mg/dL (ref 8–23)
CO2: 23 mmol/L (ref 22–32)
Calcium: 7.7 mg/dL — ABNORMAL LOW (ref 8.9–10.3)
Chloride: 109 mmol/L (ref 98–111)
Creatinine, Ser: 0.8 mg/dL (ref 0.61–1.24)
GFR, Estimated: >60 mL/min (ref >60)
Glucose, Bld: 93 mg/dL (ref 70–99)
Potassium: 3.2 mmol/L — ABNORMAL LOW (ref 3.5–5.1)
Sodium: 139 mmol/L (ref 135–145)

## 2021-02-25 LAB — CBC
HCT: 24.5 % — ABNORMAL LOW (ref 39.0–52.0)
Hemoglobin: 8.2 g/dL — ABNORMAL LOW (ref 13.0–17.0)
MCH: 31.2 pg (ref 26.0–34.0)
MCHC: 33.5 g/dL (ref 30.0–36.0)
MCV: 93.2 fL (ref 80.0–100.0)
Platelets: 377 10*3/uL (ref 150–400)
RBC: 2.63 MIL/uL — ABNORMAL LOW (ref 4.22–5.81)
RDW: 14.6 % (ref 11.5–15.5)
WBC: 6.2 10*3/uL (ref 4.0–10.5)
nRBC: 0 % (ref 0.0–0.2)

## 2021-02-25 LAB — MAGNESIUM: Magnesium: 1.7 mg/dL (ref 1.7–2.4)

## 2021-02-25 MED ORDER — PANCRELIPASE (LIP-PROT-AMYL) 12000-38000 UNITS PO CPEP
24000.0000 [IU] | ORAL_CAPSULE | Freq: Three times a day (TID) | ORAL | Status: DC
  Administered 2021-02-25: 24000 [IU] via ORAL
  Filled 2021-02-25: qty 2

## 2021-02-25 MED ORDER — PANCRELIPASE (LIP-PROT-AMYL) 24000-76000 UNITS PO CPEP: 24000.0000 [IU] | ORAL_CAPSULE | Freq: Three times a day (TID) | ORAL | 1 refills | Status: DC

## 2021-02-25 MED ORDER — PANTOPRAZOLE SODIUM 40 MG PO TBEC: 40.0000 mg | DELAYED_RELEASE_TABLET | Freq: Every day | ORAL | 1 refills | Status: DC

## 2021-02-25 MED ORDER — PROSOURCE PLUS PO LIQD
30.0000 mL | Freq: Two times a day (BID) | ORAL | Status: DC
  Administered 2021-02-25: 30 mL via ORAL
  Filled 2021-02-25: qty 30

## 2021-02-25 MED ORDER — POTASSIUM CHLORIDE 20 MEQ PO PACK
40.0000 meq | PACK | ORAL | Status: AC
  Administered 2021-02-25 (×2): 40 meq via ORAL
  Filled 2021-02-25 (×2): qty 2

## 2021-02-25 MED ORDER — BOOST / RESOURCE BREEZE PO LIQD CUSTOM
1.0000 | Freq: Three times a day (TID) | ORAL | Status: DC
  Administered 2021-02-25 (×2): 1 via ORAL

## 2021-02-25 NOTE — Discharge Summary (Signed)
Physician Discharge Summary  Manuel Castro MWN:[id number] DOB: 06-05-1951 DOA: 02/20/2021  PCP: Margot Ables, MD  Admit date: 02/20/2021 Discharge date: 02/25/2021  Admitted From: Home Disposition: Home  Recommendations for Outpatient Follow-up:  1. Follow up with PCP in 1-2 weeks 2. Please obtain BMP/CBC in one week 3. Follow-up with [facility] as an outpatient for colonoscopy  Home Health: Equipment/Devices:  Discharge Condition: Stable CODE STATUS: Full code Diet recommendation: Low-fat  Brief/Interim Summary: 70 y/o male admitted with acute pancreatitis. Started on bowel rest, IV fluids and pain management. Noted to have significant decline in hemoglobin and was transfused 1 prbc on 4/13. He did describe dark stools prior to admission. GI consulted.  Discharge Diagnoses:  Principal Problem:   Acute pancreatitis Active Problems:   Nausea & vomiting   Anemia   Leukocytosis   AKI (acute kidney injury) (HCC)   Hypokalemia   HTN (hypertension)   Protein-calorie malnutrition, severe   Charcot-Marie-Tooth disease   Melena  Acute pancreatitis -Admitted with lipase of 712 -CT abdomen confirmed pancreatitis with interval development of multilocular fluid collections consistent with pseudocysts -Suspect that he likely has an element of chronic pancreatitis -started on bowel rest and IV fluids -continue pain management -denies any recent alcohol use -MRCP did not show any bile duct abnormalities -Diet was advanced to heart healthy and he tolerated this well -Pain is reasonably controlled -He has been advised to abstain from any further alcohol  Diarrhea -Noted to have diarrhea after p.o. intake  -Suspect that he may have some malabsorption related to chronic pancreatitis -He was started on pancreatic enzymes and reported significant improvement in symptoms -He will be discharged on the same  Anemia -baseline hgb of 10 -admission hgb 9.1 -likely has a  dilutional component -may have had some GI blood loss since he describes dark stools prior to admission -Hgb down to 6.8 on 4/13 s/p 1 unit prbc -follow up hemoglobin improved to 8.3 and has been stable -He is on protonix -seen by GI and he underwent EGD on 4/14 that showed mild antral gastritis -Plans are for further outpatient work-up with colonoscopy and possible capsule endoscopy.  No further inpatient work-up planned unless hemoglobin begins to drop again -continue to follow hemoglobin  HTN -he was previously on amlodipine, but reports stopping this some time back -Blood pressures remain elevated -He reports that his primary care physician recently started him on medication which he has not started as of yet -He plans to return home and start this medication  Hypokalemia -replace  AKI -creatinine of 1.5 on admission -improved with IV hydration back to normal range  Charcot-Marie-Tooth disease -patient says he normally ambulates with bilateral leg braces and a cane  Severe protein calorie malnutrition -likely impacted by poor po intake over the last 10 days prior to admission -nutrition following  Discharge Instructions  Discharge Instructions    Diet - low sodium heart healthy   Complete by: As directed    Increase activity slowly   Complete by: As directed      Allergies as of 02/25/2021   No Known Allergies     Medication List    TAKE these medications   dorzolamide-timolol 22.3-6.8 MG/ML ophthalmic solution Commonly known as: COSOPT Place 1 drop into both eyes 2 (two) times daily.   gabapentin 600 MG tablet Commonly known as: NEURONTIN Take 600 mg by mouth 3 (three) times daily.   ondansetron 4 MG disintegrating tablet Commonly known as: ZOFRAN-ODT Take 4 mg by mouth every  8 (eight) hours as needed for nausea or vomiting (DISSOLVE ORALLY).   Pancrelipase (Lip-Prot-Amyl) 24000-76000 units Cpep Take 1 capsule (24,000 Units total) by mouth 3 (three)  times daily with meals. Start taking on: February 26, 2021   pantoprazole 40 MG tablet Commonly known as: Protonix Take 1 tablet (40 mg total) by mouth daily.   terazosin 5 MG capsule Commonly known as: HYTRIN Take 5 mg by mouth at bedtime.   traMADol 50 MG tablet Commonly known as: ULTRAM Take 50 mg by mouth 3 (three) times daily.   traZODone 100 MG tablet Commonly known as: DESYREL Take 100 mg by mouth at bedtime.       Follow-up Information    Charlott Rakes, MD Follow up.   Specialty: Gastroenterology Why: call for appointment next week Contact information: [address] [place] [address] [phone]              No Known Allergies  Consultations:  Gastroenterology   Procedures/Studies: DG Chest 2 View  Result Date: 02/20/2021 CLINICAL DATA:  Chest and abdominal pain for the past 2 weeks. EXAM: CHEST - 2 VIEW COMPARISON:  None. FINDINGS: The heart size and mediastinal contours are within normal limits. Normal pulmonary vascularity. Hyperinflated lungs with coarsened interstitial markings. No focal consolidation, pleural effusion, or pneumothorax. Nipple shadow noted at the right lung base. No acute osseous abnormality. IMPRESSION: 1. No acute cardiopulmonary disease. 2. COPD. Electronically Signed   By: Obie Dredge M.D.   On: 02/20/2021 14:47   CT ABDOMEN PELVIS W CONTRAST  Result Date: 02/20/2021 CLINICAL DATA:  Epigastric pain EXAM: CT ABDOMEN AND PELVIS WITH CONTRAST TECHNIQUE: Multidetector CT imaging of the abdomen and pelvis was performed using the standard protocol following bolus administration of intravenous contrast. CONTRAST:  OMNIPAQUE IOHEXOL 300 MG/ML  SOLN COMPARISON:  02/07/2021 FINDINGS: Lower chest: No acute pleural or parenchymal lung disease. Hepatobiliary: No focal liver abnormality is seen. No gallstones, gallbladder wall thickening, or biliary dilatation. Pancreas: Inflammatory changes are again seen  surrounding the pancreas, consistent with acute pancreatitis. The parenchyma enhances normally. Parenchymal calcifications again noted within the uncinate process. In the interim since prior study, multilocular fluid collections have developed compatible with pseudocysts. The largest is seen extending from the pancreatic head into the porta hepatis measuring 2.6 x 3.1 cm reference image 19/3. Multilocular collections along the ventral aspect of the pancreatic body are also noted, measuring up to 1.9 x 1.9 cm reference image 31/3. Spleen: Normal in size without focal abnormality. Adrenals/Urinary Tract: Stable chronic severe left renal atrophy, with continued dilatation of the left renal pelvis and proximal left ureter. Compensatory hypertrophy of the right kidney, with numerous right renal cysts unchanged. No evidence of urinary tract calculi. The bladder and adrenals are unremarkable. Stomach/Bowel: There is no evidence of bowel obstruction. Scattered gas fluid levels within several loops of jejunum within the left upper quadrant could reflect mild ileus. There is wall thickening of the ascending and proximal transverse colon, likely due to the adjacent inflammatory changes due to acute pancreatitis. The structure within the proximal colonic lumen on prior study has passed in the interim, consistent with stool. No evidence of persistent mass. The appendix is normal. Vascular/Lymphatic: Aortic atherosclerosis. No enlarged abdominal or pelvic lymph nodes. Reproductive: Prostate is unremarkable. Right-sided hydrocele again partially visualized. Other: Trace free fluid within the right paracolic gutter. No free intraperitoneal gas. Small fat containing right inguinal hernia. No bowel herniation. Musculoskeletal: No acute or destructive bony lesions.  Reconstructed images demonstrate stable multilevel lumbar spondylosis greatest at L2-3, L4-5, and L5-S1. IMPRESSION: 1. Progression of acute pancreatitis, with interval  development of multilocular fluid collections consistent with pseudocysts. 2. Mild wall thickening of decompressed proximal colon, likely due to the adjacent inflammatory changes due to pancreatitis. 3. Scattered gas fluid levels within loops of jejunum in the left upper quadrant which may reflect mild ileus. No bowel obstruction. 4. Other stable findings throughout the abdomen and pelvis as above. Electronically Signed   By: Sharlet Salina M.D.   On: 02/20/2021 18:14   CT ABDOMEN PELVIS W CONTRAST  Result Date: 02/07/2021 CLINICAL DATA:  Right sided abdominal pain with nausea EXAM: CT ABDOMEN AND PELVIS WITH CONTRAST TECHNIQUE: Multidetector CT imaging of the abdomen and pelvis was performed using the standard protocol following bolus administration of intravenous contrast. CONTRAST:  OMNIPAQUE IOHEXOL 300 MG/ML  SOLN COMPARISON:  Lumbar MRI July 23, 2020. FINDINGS: Lower chest: There is scarring and atelectatic change in the lung bases. No airspace consolidation in the lung bases. Hepatobiliary: No focal liver lesions are appreciable. Subtle lobulation along the contour of the liver noted. Gallbladder wall is not appreciably thickened. There is no appreciable biliary duct dilatation. Pancreas: There is mild dilatation of the pancreatic duct. There is a focal calcification in the mid pancreas. There is fluid located between the pancreas and lesser curvature of the stomach without well-defined pseudocyst. There is a 1 x 1 cm cystic appearing area at the junction of the head and body of the pancreas. Normal pancreatic enhancement throughout its course. Spleen: No splenic lesions are appreciable. 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 Adrenals/Urinary Tract: Adrenals bilaterally appear normal. The left kidney is markedly atrophic with localized dilatation of the left renal collecting system. There is a cyst arising from the upper pole of the atrophic left kidney measuring  1.2 x 1.2 cm. Right kidney shows a degree of compensatory hypertrophy. There is a cyst in the mid right kidney measuring 4.1 x 4.0 cm. There is a 9 x 8 mm cyst in the lower pole of the right kidney. There is a cyst arising in the mid to lower right kidney measuring 4.0 x 3.7 cm. No evident hydronephrosis on the right. No appreciable renal or ureteral calculus on either side. Urinary bladder is midline with wall thickness within normal limits. Stomach/Bowel: There is moderate stool throughout the colon. There is a focal area of soft tissue opacity in the ascending colon measuring 4.9 x 3.6 cm. No similar appearance of this nature elsewhere in colon. There is no appreciable bowel wall or mesenteric thickening. No bowel obstruction is evident. No free air or portal venous air. The terminal ileum appears normal. Appendix appears normal. Vascular/Lymphatic: There is no abdominal aortic aneurysm. There are foci of aortic atherosclerosis. Major venous structures appear patent. No evident adenopathy in the abdomen or pelvis. Reproductive: Prostate and seminal vesicles normal in size and configuration. Right scrotal hydrocele incompletely visualized. Other: No evident abscess or ascites in the abdomen or pelvis. There is mild fat in the right inguinal ring. Musculoskeletal: There is degenerative change in the lumbar spine. There is mild anterior wedging of the L2 vertebral body, present on prior lumbar MR and grossly stable. No blastic or lytic bone lesions are evident. No intramuscular lesions. IMPRESSION: 1. Soft tissue opacity in the proximal ascending colon measuring 4.9 x 3.6 cm. While this area could represent inspissated stool, a colonic mass in this area cannot  be excluded. Advise direct visualization of this region after appropriate colonic cleansing. 2. Findings indicative of pancreatitis with fluid tracking between the pancreas in the lesser curvature of the stomach. There is mild pancreatic duct dilatation. There  is a 1 x 1 cm cystic appearing mass at the junction of the head and body of the pancreas. This finding likely represents a small pseudocyst. A small cystic neoplasm could present in this manner. This finding may warrant nonemergent MR of the pancreas pre and post-contrast to further evaluate. There is a focal calcification in the body of the pancreas which may represent residua of chronic pancreatitis. No pancreatic necrosis evident. 3. Subtle lobulation of the liver contour raises question of underlying cirrhosis. No focal liver lesions evident. 4. Atrophic left kidney with mild hydronephrosis on the left. No obstructing focus noted in the left collecting system or ureter. Suspected compensatory hypertrophy right kidney. No right renal calculus or ureterectasis. Urinary bladder thickness within normal limits. 5.  Aortic Atherosclerosis (ICD10-I70.0). 6. 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 Stable wedging at L2. 7.  Incomplete visualization of right scrotal hydrocele. Electronically Signed   By: Bretta BangWilliam  Woodruff III M.D.   On: 02/07/2021 12:39   MR ABDOMEN MRCP W WO CONTAST  Result Date: 02/21/2021 CLINICAL DATA:  Pancreatitis. EXAM: MRI ABDOMEN WITHOUT AND WITH CONTRAST (INCLUDING MRCP) TECHNIQUE: Multiplanar multisequence MR imaging of the abdomen was performed both before and after the administration of intravenous contrast. Heavily T2-weighted images of the biliary and pancreatic ducts were obtained, and three-dimensional MRCP images were rendered by post processing. CONTRAST:  5.226mL GADAVIST GADOBUTROL 1 MMOL/ML IV SOLN COMPARISON:  CT scan 02/20/2021 FINDINGS: Examination limited by respiratory motion. Lower chest: The lung bases are clear of an acute process. No pulmonary lesions or pleural effusions. Hepatobiliary: No hepatic lesions or intrahepatic biliary dilatation. The gallbladder is mildly distended. No gallstones or gallbladder wall thickening. Normal caliber and course of the common bile  duct. No definite common bile duct stones are identified. 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 These are stable. No findings for pancreatic necrosis. No abscess or hematoma. Spleen:  Normal size. No focal lesions. Adrenals/Urinary Tract: Adrenal glands are unremarkable. Small atrophic left kidney. Stable right renal cysts. Stomach/Bowel: The stomach, duodenum, visualized small bowel and visualized colon are grossly normal. Vascular/Lymphatic: The aorta and branch vessels are patent. The major venous structures are patent. Scattered mesenteric and retroperitoneal lymph nodes are unchanged and likely reactive/inflammatory. Other:  Mesenteric edema and scattered fluid but no overt ascites. Musculoskeletal: No significant bony findings. IMPRESSION: 1. 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 No findings for pancreatic necrosis. 2. Normal caliber and course of the common bile duct. No definite common bile duct stones. 3. Mild gallbladder distention but no definite gallstones. 4. No other acute abdominal findings, mass lesions or adenopathy. Electronically Signed   By: Rudie MeyerP.  Gallerani M.D.   On: 02/21/2021 16:56       Subjective: Feels better.  Tolerating solid diet.  No vomiting.  Pain is controlled.  Discharge Exam: Vitals:   02/24/21 1329 02/24/21 2133 02/25/21 0522 02/25/21 1429  BP: (!) 173/90 (!) 147/101 (!) 139/92 (!) 147/82  Pulse:  75 68 81  Resp:  16 17   Temp:  97.6 F (36.4 C) 98.1 F (36.7 C) 97.9 F (36.6 C)  TempSrc:   Oral Oral  SpO2:  100% 98% 100%  Weight:      Height:  General: Pt is alert, awake, not in acute distress Cardiovascular: RRR, S1/S2 +, no rubs, no gallops Respiratory: CTA bilaterally, no wheezing, no rhonchi Abdominal: Soft, NT, ND, bowel sounds + Extremities: no edema, no cyanosis    The results of significant diagnostics from this hospitalization  (including imaging, microbiology, ancillary and laboratory) are listed below for reference.     Microbiology: Recent Results (from the past 240 hour(s))  Resp Panel by RT-PCR (Flu A&B, Covid) Nasopharyngeal Swab     Status: None   Collection Time: 02/20/21  9:13 PM   Specimen: Nasopharyngeal Swab; Nasopharyngeal(NP) swabs in vial transport medium  Result Value Ref Range Status   SARS Coronavirus 2 by RT PCR NEGATIVE NEGATIVE Final    Comment: (NOTE) SARS-CoV-2 target nucleic acids are NOT DETECTED.  The SARS-CoV-2 RNA is generally detectable in upper respiratory specimens during the acute phase of infection. The lowest concentration of SARS-CoV-2 viral copies this assay can detect is 138 copies/mL. A negative result does not preclude SARS-Cov-2 infection and should not be used as the sole basis for treatment or other patient management decisions. A negative result may occur with  improper specimen collection/handling, submission of specimen other than nasopharyngeal swab, presence of viral mutation(s) within the areas targeted by this assay, and inadequate number of viral copies(<138 copies/mL). A negative result must be combined with clinical observations, patient history, and epidemiological information. The expected result is Negative.  Fact Sheet for Patients:  BloggerCourse.com  Fact Sheet for Healthcare Providers:  SeriousBroker.it  This test is no t yet approved or cleared by the [place] FDA and  has been authorized for detection and/or diagnosis of SARS-CoV-2 by FDA under an Emergency Use Authorization (EUA). This EUA will remain  in effect (meaning this test can be used) for the duration of the COVID-19 declaration under Section 564(b)(1) of the Act, 21 U.S.C.section 360bbb-3(b)(1), unless the authorization is terminated  or revoked sooner.       Influenza A by PCR NEGATIVE NEGATIVE Final   Influenza B by PCR  NEGATIVE NEGATIVE Final    Comment: (NOTE) The Xpert Xpress SARS-CoV-2/FLU/RSV plus assay is intended as an aid in the diagnosis of influenza from Nasopharyngeal swab specimens and should not be used as a sole basis for treatment. Nasal washings and aspirates are unacceptable for Xpert Xpress SARS-CoV-2/FLU/RSV testing.  Fact Sheet for Patients: BloggerCourse.com  Fact Sheet for Healthcare Providers: SeriousBroker.it  This test is not yet approved or cleared by the [place] FDA and has been authorized for detection and/or diagnosis of SARS-CoV-2 by FDA under an Emergency Use Authorization (EUA). This EUA will remain in effect (meaning this test can be used) for the duration of the COVID-19 declaration under Section 564(b)(1) of the Act, 21 U.S.C. section 360bbb-3(b)(1), unless the authorization is terminated or revoked.  Performed at [facility]      Labs: BNP (last 3 results) No results for input(s): BNP in the last 8760 hours. Basic Metabolic Panel: Recent Labs  Lab 02/21/21 0302 02/22/21 0103 02/23/21 0317 02/24/21 0045 02/25/21 0041  NA 140 140 140 138 139  K 3.4* 3.7 4.2 3.1* 3.2*  CL 112* 112* 108 109 109  CO2 15* 22 23 19* 23  GLUCOSE 69* 105* 94 76 93  BUN CREATININE 1.26* 1.01 0.98 0.94 0.80  CALCIUM 8.0* 8.0* 7.9* 7.7* 7.7*  MG 1.6*  --  2.0  --  1.7  PHOS 2.2*  --  2.5  2.8  --    Liver Function Tests: Recent Labs  Lab 02/20/21 1344 02/21/21 0302 02/22/21 0103 02/23/21 0317 02/24/21 0045  AST 14* 14* 16  --   --   ALT --   --   ALKPHOS 168* 148* 111  --   --   BILITOT 2.6* 1.3* 0.8  --   --   PROT 5.7* 5.4* 4.4*  --   --   ALBUMIN 2.1* 2.0* 1.6* 1.8* 1.8*   Recent Labs  Lab 02/20/21 1344 02/23/21 0317  LIPASE 712* 58*   No results for input(s): AMMONIA in the last 168 hours. CBC: Recent Labs  Lab 02/22/21 0103 02/22/21 1445 02/23/21 0317  02/24/21 0045 02/25/21 0041  WBC 7.8 6.3 7.1 7.0 6.2  HGB 6.8* 8.3* 8.7* 8.4* 8.2*  HCT 20.5* 24.7* 25.4* 25.6* 24.5*  MCV 95.3 93.9 94.1 94.8 93.2  PLT 417* 380 410* 428* 377   Cardiac Enzymes: No results for input(s): CKTOTAL, CKMB, CKMBINDEX, TROPONINI in the last 168 hours. BNP: Invalid input(s): POCBNP CBG: No results for input(s): GLUCAP in the last 168 hours. D-Dimer No results for input(s): DDIMER in the last 72 hours. Hgb A1c No results for input(s): HGBA1C in the last 72 hours. Lipid Profile No results for input(s): CHOL, HDL, LDLCALC, TRIG, CHOLHDL, LDLDIRECT in the last 72 hours. Thyroid function studies No results for input(s): TSH, T4TOTAL, T3FREE, THYROIDAB in the last 72 hours.  Invalid input(s): FREET3 Anemia work up No results for input(s): VITAMINB12, FOLATE, FERRITIN, TIBC, IRON, RETICCTPCT in the last 72 hours. Urinalysis    Component Value Date/Time   COLORURINE YELLOW 02/07/2021 1226   APPEARANCEUR CLEAR 02/07/2021 1226   LABSPEC 1.044 (H) 02/07/2021 1226   PHURINE 6.0 02/07/2021 1226   GLUCOSEU NEGATIVE 02/07/2021 1226   HGBUR NEGATIVE 02/07/2021 1226   BILIRUBINUR NEGATIVE 02/07/2021 1226   KETONESUR 5 (A) 02/07/2021 1226   PROTEINUR 100 (A) 02/07/2021 1226   NITRITE NEGATIVE 02/07/2021 1226   LEUKOCYTESUR NEGATIVE 02/07/2021 1226   Sepsis Labs Invalid input(s): PROCALCITONIN,  WBC,  LACTICIDVEN Microbiology Recent Results (from the past 240 hour(s))  Resp Panel by RT-PCR (Flu A&B, Covid) Nasopharyngeal Swab     Status: None   Collection Time: 02/20/21  9:13 PM   Specimen: Nasopharyngeal Swab; Nasopharyngeal(NP) swabs in vial transport medium  Result Value Ref Range Status   SARS Coronavirus 2 by RT PCR NEGATIVE NEGATIVE Final    Comment: (NOTE) SARS-CoV-2 target nucleic acids are NOT DETECTED.  The SARS-CoV-2 RNA is generally detectable in upper respiratory specimens during the acute phase of infection. The lowest concentration of  SARS-CoV-2 viral copies this assay can detect is 138 copies/mL. A negative result does not preclude SARS-Cov-2 infection and should not be used as the sole basis for treatment or other patient management decisions. A negative result may occur with  improper specimen collection/handling, submission of specimen other than nasopharyngeal swab, presence of viral mutation(s) within the areas targeted by this assay, and inadequate number of viral copies(<138 copies/mL). A negative result must be combined with clinical observations, patient history, and epidemiological information. The expected result is Negative.  Fact Sheet for Patients:  BloggerCourse.com  Fact Sheet for Healthcare Providers:  SeriousBroker.it  This test is no t yet approved or cleared by the [place] FDA and  has been authorized for detection and/or diagnosis of SARS-CoV-2 by FDA under an Emergency Use Authorization (EUA). This EUA will remain  in effect (meaning this test  can be used) for the duration of the COVID-19 declaration under Section 564(b)(1) of the Act, 21 U.S.C.section 360bbb-3(b)(1), unless the authorization is terminated  or revoked sooner.       Influenza A by PCR NEGATIVE NEGATIVE Final   Influenza B by PCR NEGATIVE NEGATIVE Final    Comment: (NOTE) The Xpert Xpress SARS-CoV-2/FLU/RSV plus assay is intended as an aid in the diagnosis of influenza from Nasopharyngeal swab specimens and should not be used as a sole basis for treatment. Nasal washings and aspirates are unacceptable for Xpert Xpress SARS-CoV-2/FLU/RSV testing.  Fact Sheet for Patients: BloggerCourse.com  Fact Sheet for Healthcare Providers: SeriousBroker.it  This test is not yet approved or cleared by the [place] FDA and has been authorized for detection and/or diagnosis of SARS-CoV-2 by FDA under an Emergency Use  Authorization (EUA). This EUA will remain in effect (meaning this test can be used) for the duration of the COVID-19 declaration under Section 564(b)(1) of the Act, 21 U.S.C. section 360bbb-3(b)(1), unless the authorization is terminated or revoked.  Performed at [facility]      Time coordinating discharge:  SIGNED:   Erick Blinks, MD  [facility] 02/25/2021, 5:53 PM   If 7PM-7AM, please contact night-coverage [email or web address]

## 2021-02-25 NOTE — Plan of Care (Signed)
  Problem: Clinical Measurements: Goal: Ability to maintain clinical measurements within normal limits will improve Outcome: Progressing Goal: Will remain free from infection Outcome: Progressing Goal: Diagnostic test results will improve Outcome: Progressing Goal: Respiratory complications will improve Outcome: Progressing Goal: Cardiovascular complication will be avoided Outcome: Progressing   Problem: Coping: Goal: Level of anxiety will decrease Outcome: Progressing   Problem: Elimination: Goal: Will not experience complications related to bowel motility Outcome: Progressing Goal: Will not experience complications related to urinary retention Outcome: Progressing   Problem: Education: Goal: Knowledge of Pancreatitis treatment and prevention will improve Outcome: Progressing   Problem: Clinical Measurements: Goal: Complications related to the disease process, condition or treatment will be avoided or minimized Outcome: Progressing  Discussed pancreatitis and the importance of adapting diet to prevent symptoms and progression of illness.

## 2021-02-25 NOTE — Progress Notes (Signed)
Pt discharged to private residence via son's vehicle.  Escorted to exit via wheelchair accompanied by this nurse.  Belongings and all discharge paperwork with patient.

## 2021-02-25 NOTE — Plan of Care (Signed)
Problem: Education: Goal: Knowledge of General Education information will improve Description: Including pain rating scale, medication(s)/side effects and non-pharmacologic comfort measures 02/25/2021 1832 by Elmarie Shiley, RN Outcome: Completed/Met 02/25/2021 1703 by Elmarie Shiley, RN Outcome: Progressing   Problem: Health Behavior/Discharge Planning: Goal: Ability to manage health-related needs will improve 02/25/2021 1832 by Elmarie Shiley, RN Outcome: Completed/Met 02/25/2021 1703 by Elmarie Shiley, RN Outcome: Progressing   Problem: Clinical Measurements: Goal: Ability to maintain clinical measurements within normal limits will improve 02/25/2021 1832 by Elmarie Shiley, RN Outcome: Completed/Met 02/25/2021 1703 by Elmarie Shiley, RN Outcome: Progressing Goal: Will remain free from infection 02/25/2021 1832 by Elmarie Shiley, RN Outcome: Completed/Met 02/25/2021 1703 by Elmarie Shiley, RN Outcome: Progressing Goal: Diagnostic test results will improve 02/25/2021 1832 by Elmarie Shiley, RN Outcome: Completed/Met 02/25/2021 1703 by Elmarie Shiley, RN Outcome: Progressing Goal: Respiratory complications will improve 02/25/2021 1832 by Elmarie Shiley, RN Outcome: Completed/Met 02/25/2021 1703 by Elmarie Shiley, RN Outcome: Progressing Goal: Cardiovascular complication will be avoided 02/25/2021 1832 by Elmarie Shiley, RN Outcome: Completed/Met 02/25/2021 1703 by Elmarie Shiley, RN Outcome: Progressing   Problem: Activity: Goal: Risk for activity intolerance will decrease 02/25/2021 1832 by Elmarie Shiley, RN Outcome: Completed/Met 02/25/2021 1703 by Elmarie Shiley, RN Outcome: Progressing   Problem: Nutrition: Goal: Adequate nutrition will be maintained 02/25/2021 1832 by Elmarie Shiley, RN Outcome: Completed/Met 02/25/2021 1703 by Elmarie Shiley, RN Outcome: Progressing   Problem:  Coping: Goal: Level of anxiety will decrease 02/25/2021 1832 by Elmarie Shiley, RN Outcome: Completed/Met 02/25/2021 1703 by Elmarie Shiley, RN Outcome: Progressing   Problem: Elimination: Goal: Will not experience complications related to bowel motility 02/25/2021 1832 by Elmarie Shiley, RN Outcome: Completed/Met 02/25/2021 1703 by Elmarie Shiley, RN Outcome: Progressing Goal: Will not experience complications related to urinary retention 02/25/2021 1832 by Elmarie Shiley, RN Outcome: Completed/Met 02/25/2021 1703 by Elmarie Shiley, RN Outcome: Progressing   Problem: Pain Managment: Goal: General experience of comfort will improve 02/25/2021 1832 by Elmarie Shiley, RN Outcome: Completed/Met 02/25/2021 1703 by Elmarie Shiley, RN Outcome: Progressing   Problem: Safety: Goal: Ability to remain free from injury will improve 02/25/2021 1832 by Elmarie Shiley, RN Outcome: Completed/Met 02/25/2021 1703 by Elmarie Shiley, RN Outcome: Progressing   Problem: Skin Integrity: Goal: Risk for impaired skin integrity will decrease 02/25/2021 1832 by Elmarie Shiley, RN Outcome: Completed/Met 02/25/2021 1703 by Elmarie Shiley, RN Outcome: Progressing   Problem: Inadequate Intake (NI-2.1) Goal: Food and/or nutrient delivery Description: Individualized approach for food/nutrient provision. Outcome: Completed/Met   Problem: Education: Goal: Knowledge of Pancreatitis treatment and prevention will improve 02/25/2021 1832 by Elmarie Shiley, RN Outcome: Completed/Met 02/25/2021 1703 by Elmarie Shiley, RN Outcome: Progressing   Problem: Health Behavior/Discharge Planning: Goal: Ability to formulate a plan to maintain an alcohol-free life will improve 02/25/2021 1832 by Elmarie Shiley, RN Outcome: Completed/Met 02/25/2021 1703 by Elmarie Shiley, RN Outcome: Progressing   Problem: Nutritional: Goal: Ability to achieve  adequate nutritional intake will improve 02/25/2021 1832 by Elmarie Shiley, RN Outcome: Completed/Met 02/25/2021 1703 by Elmarie Shiley, RN Outcome: Progressing   Problem: Clinical Measurements: Goal: Complications related to the disease process, condition or treatment will be avoided or minimized 02/25/2021 1832 by Elmarie Shiley, RN Outcome: Completed/Met 02/25/2021 1703 by Elmarie Shiley, RN Outcome: Progressing Discharge instructions reviewed with patient and his  son.  These included, but were not limited to, the following:  medications, prescriptions, dietary recommendations, when to call the MD, pain management, follow-up appointments, etc.

## 2021-02-25 NOTE — Plan of Care (Signed)
  Problem: Education: Goal: Knowledge of General Education information will improve Description Including pain rating scale, medication(s)/side effects and non-pharmacologic comfort measures Outcome: Progressing   

## 2021-02-28 LAB — METHYLMALONIC ACID, SERUM: Methylmalonic Acid, Quantitative: 67 nmol/L (ref 0–378)

## 2021-03-08 DIAGNOSIS — D649 Anemia, unspecified: Secondary | ICD-10-CM | POA: Insufficient documentation

## 2021-03-13 ENCOUNTER — Encounter (HOSPITAL_COMMUNITY): Payer: Medicare HMO

## 2021-03-14 ENCOUNTER — Other Ambulatory Visit: Payer: Self-pay

## 2021-03-14 ENCOUNTER — Non-Acute Institutional Stay (HOSPITAL_COMMUNITY)
Admission: RE | Admit: 2021-03-14 | Discharge: 2021-03-14 | Disposition: A | Discharge status: Home / Self Care | Payer: Medicare HMO | Source: Ambulatory Visit | Attending: Internal Medicine | Admitting: Internal Medicine

## 2021-03-14 DIAGNOSIS — D649 Anemia, unspecified: Secondary | ICD-10-CM | POA: Diagnosis present

## 2021-03-14 LAB — PREPARE RBC (CROSSMATCH)

## 2021-03-14 MED ORDER — ACETAMINOPHEN 500 MG PO TABS
1000.0000 mg | ORAL_TABLET | Freq: Once | ORAL | Status: AC
  Administered 2021-03-14: 1000 mg via ORAL
  Filled 2021-03-14: qty 2

## 2021-03-14 MED ORDER — CLONIDINE HCL 0.1 MG PO TABS
0.1000 mg | ORAL_TABLET | Freq: Once | ORAL | Status: DC
  Filled 2021-03-14: qty 1

## 2021-03-14 MED ORDER — SODIUM CHLORIDE 0.9% IV SOLUTION: Freq: Once | INTRAVENOUS | Status: AC

## 2021-03-14 MED ORDER — FUROSEMIDE 10 MG/ML IJ SOLN
20.0000 mg | Freq: Once | INTRAMUSCULAR | Status: AC
  Administered 2021-03-14: 20 mg via INTRAVENOUS
  Filled 2021-03-14: qty 2

## 2021-03-14 MED ORDER — LISINOPRIL 5 MG PO TABS
5.0000 mg | ORAL_TABLET | Freq: Once | ORAL | Status: AC
  Administered 2021-03-14: 5 mg via ORAL
  Filled 2021-03-14: qty 1

## 2021-03-14 NOTE — Discharge Instructions (Signed)

## 2021-03-14 NOTE — Progress Notes (Signed)
Patient received via PIV 2 unit of packed red blood cells. Type and screen was done before transfusion. Consent obtained for blood transfusion.  Pre transfusion BP was 166/81, HR 107. Ann Maki NP at [facility] Primary care was notified, verbal order given for 5mg  Lisinopril PO, and recheck BP/HR in 30 mins. Additional verbal order given for Clonidine PO if BP remains 140/90 and higher during transfusion. BP 165/88 and HR 110, thirty  minutes after lisinopril was given to pt. NP notified of blood pressure and also made aware that pt will refuse clonidine as he states it "makes him crazy and sick to his stomach". Per  D. Daye NP, begin blood transfusion at a slow rate, and notify provider if blood pressure exceeds baseline BP of 160s/80s.  Pre transfusion medications were not ordered, however verbal order given by NP to give Tylenol PO for pt's c/o abominable pain rated 6/10. Patient received 1x lasix IVP between units of PRBCs. Tolerated transfusions well, vitals stable post transfusion BP 144/68 , discharge instructions given, verbalized understanding. Patient alert, oriented at the time of discharge, taken in wheelchair by staff to meet pt's friend in lobby for transport.

## 2021-03-15 LAB — TYPE AND SCREEN
ABO/RH(D): O POS
Antibody Screen: NEGATIVE
Unit division: 0
Unit division: 0

## 2021-03-15 LAB — BPAM RBC
Blood Product Expiration Date: 202205212359
Blood Product Expiration Date: 202205252359
ISSUE DATE / TIME: 202205030936
ISSUE DATE / TIME: 202205030936
Unit Type and Rh: 5100
Unit Type and Rh: 5100

## 2021-03-29 DIAGNOSIS — K863 Pseudocyst of pancreas: Secondary | ICD-10-CM | POA: Insufficient documentation

## 2021-04-11 ENCOUNTER — Other Ambulatory Visit: Payer: Self-pay | Admitting: Gastroenterology

## 2021-04-11 DIAGNOSIS — K863 Pseudocyst of pancreas: Secondary | ICD-10-CM

## 2021-04-11 DIAGNOSIS — K859 Acute pancreatitis without necrosis or infection, unspecified: Secondary | ICD-10-CM

## 2021-06-05 ENCOUNTER — Other Ambulatory Visit: Payer: Self-pay

## 2021-06-05 ENCOUNTER — Ambulatory Visit
Admission: RE | Admit: 2021-06-05 | Discharge: 2021-06-05 | Disposition: A | Discharge status: Home / Self Care | Payer: Medicare HMO | Source: Ambulatory Visit | Attending: Gastroenterology | Admitting: Gastroenterology

## 2021-06-05 DIAGNOSIS — K863 Pseudocyst of pancreas: Secondary | ICD-10-CM

## 2021-06-05 DIAGNOSIS — K859 Acute pancreatitis without necrosis or infection, unspecified: Secondary | ICD-10-CM

## 2021-06-05 MED ORDER — IOPAMIDOL (ISOVUE-300) INJECTION 61%
100.0000 mL | Freq: Once | INTRAVENOUS | Status: AC | PRN
  Administered 2021-06-05: 100 mL via INTRAVENOUS

## 2021-10-09 DIAGNOSIS — E785 Hyperlipidemia, unspecified: Secondary | ICD-10-CM | POA: Insufficient documentation

## 2021-10-09 DIAGNOSIS — H6120 Impacted cerumen, unspecified ear: Secondary | ICD-10-CM | POA: Insufficient documentation

## 2022-01-17 DIAGNOSIS — S37009A Unspecified injury of unspecified kidney, initial encounter: Secondary | ICD-10-CM | POA: Insufficient documentation

## 2022-01-17 DIAGNOSIS — Z8719 Personal history of other diseases of the digestive system: Secondary | ICD-10-CM | POA: Insufficient documentation

## 2022-06-12 DIAGNOSIS — I728 Aneurysm of other specified arteries: Secondary | ICD-10-CM | POA: Insufficient documentation

## 2022-07-10 DIAGNOSIS — F1190 Opioid use, unspecified, uncomplicated: Secondary | ICD-10-CM | POA: Insufficient documentation

## 2022-09-18 DIAGNOSIS — J449 Chronic obstructive pulmonary disease, unspecified: Secondary | ICD-10-CM | POA: Insufficient documentation

## 2022-09-26 IMAGING — MR MR ABDOMEN WO/W CM MRCP
9 of 17 series · 16 of 48 positions shown · IV contrast (gadavist)
Comparison: CT scan 02/20/2021

CLINICAL DATA: Pancreatitis.

EXAM:
MRI ABDOMEN WITHOUT AND WITH CONTRAST (INCLUDING MRCP)
TECHNIQUE: Multiplanar multisequence MR imaging of the abdomen was performed
both before and after the administration of intravenous contrast.
Heavily T2-weighted images of the biliary and pancreatic ducts were
obtained, and three-dimensional MRCP images were rendered by post
processing.
CONTRAST:  5.6mL GADAVIST GADOBUTROL 1 MMOL/ML IV SOLN

[Series 3: cor ssfse nav · coronal · 6.0mm · 0.78mm/px · 2 of 32 slices shown]
[im 1/32]
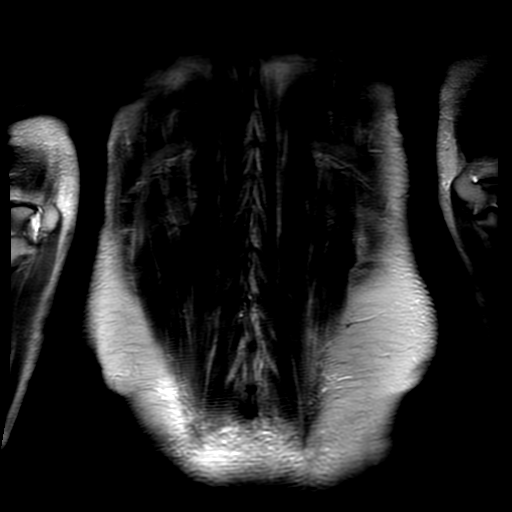
[im 32/32]
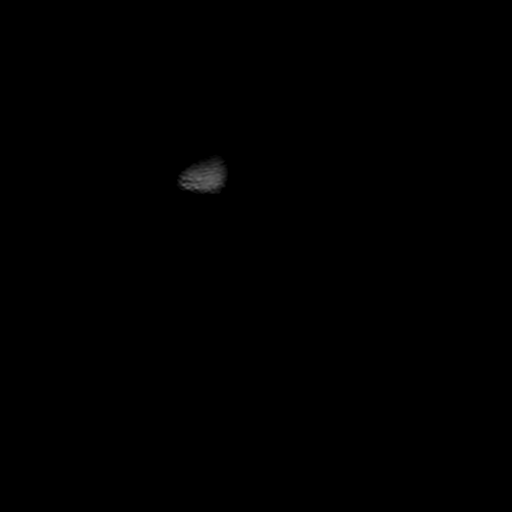

[Series 4: ax ssfse nav · axial · 6.0mm · 0.74mm/px · z∈[-125,+132]mm · 2 of 44 slices shown]
[im 1/44]
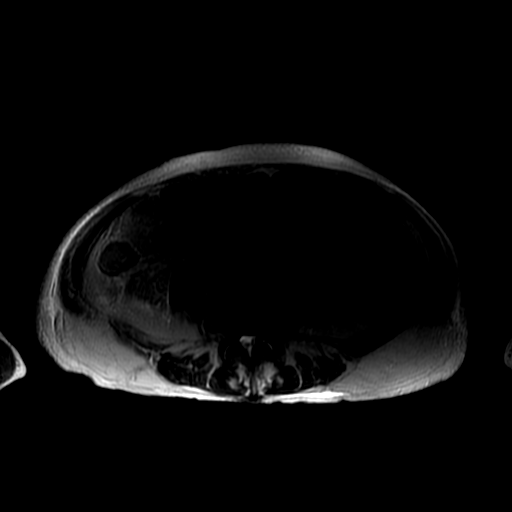
[im 44/44]
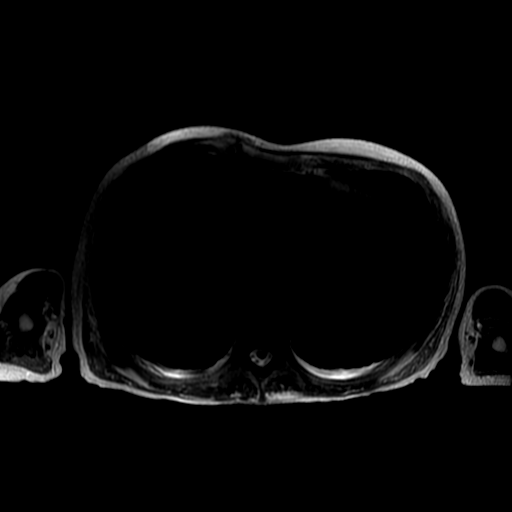

[Series 6: T2 fat-sat · axial · 6.0mm · 0.74mm/px · 1 of 40 slices shown]
[im 1/40]
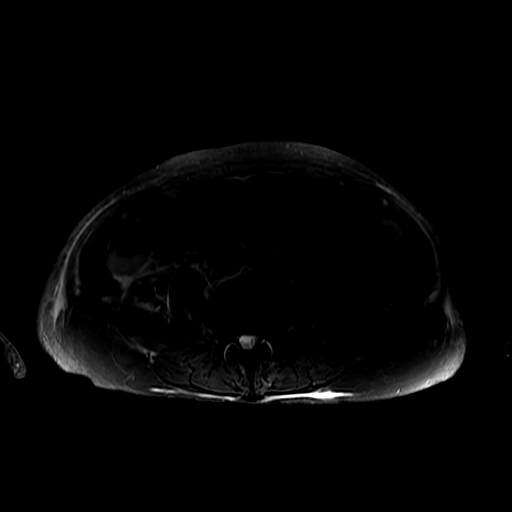

[Series 7: DWI b500 · axial · 8.0mm · 1.48mm/px · z∈[-121,+128]mm · 2 of 52 slices shown]
[im 1/52]
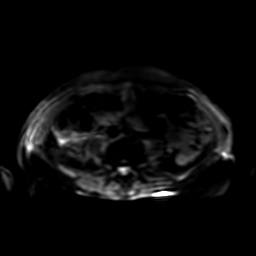
[im 52/52]
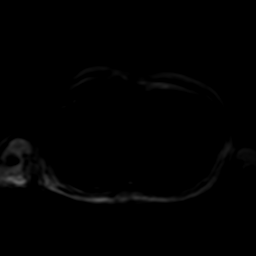

[Series 10: radial 2d thick · sagittal · 40.0mm · 0.86mm/px · 1 of 6 slices shown]
[im 1/6]
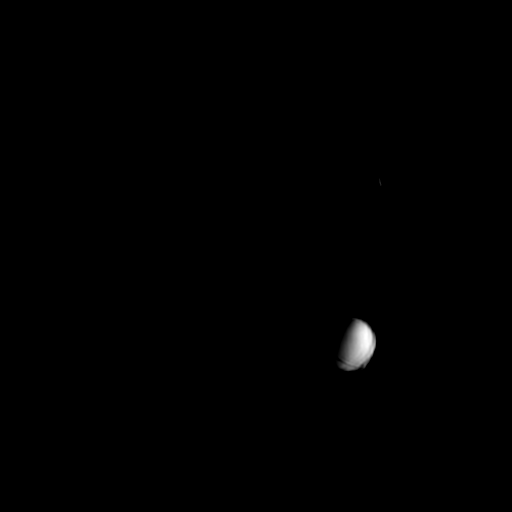

[Series 11: bSSFP · coronal · 6.0mm · 0.78mm/px · 1 of 34 slices shown]
[im 1/34]
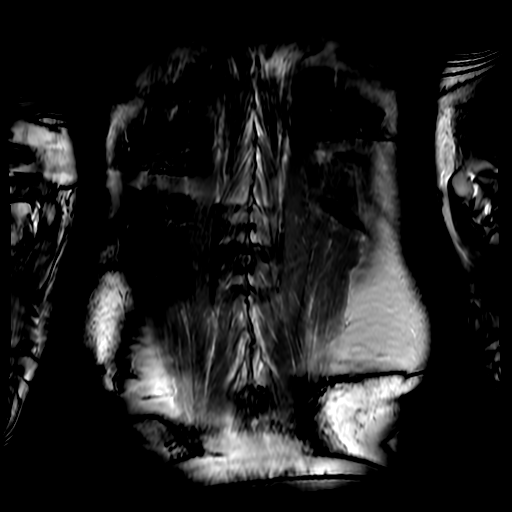

[Series 801: T1 dynamic · axial · 4.9mm · 0.82mm/px · z∈[-127,+136]mm · 3 of 106 slices shown (1 of 2)]
[im 1/106]
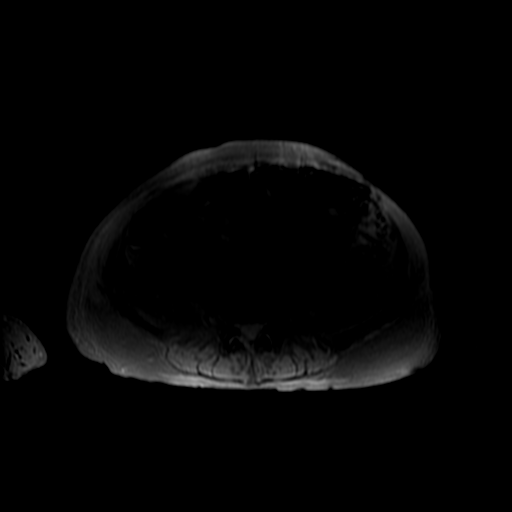
[im 53/106]
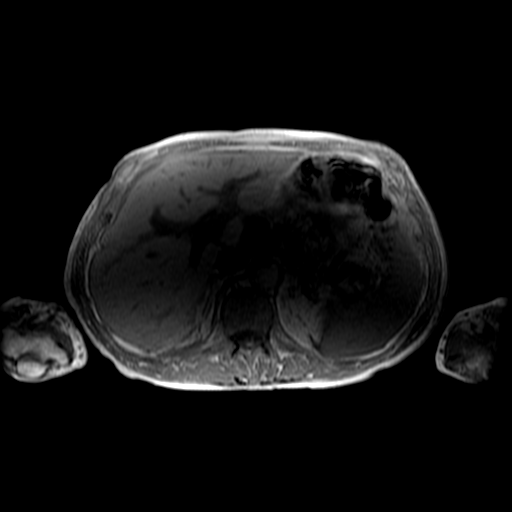
[im 106/106]
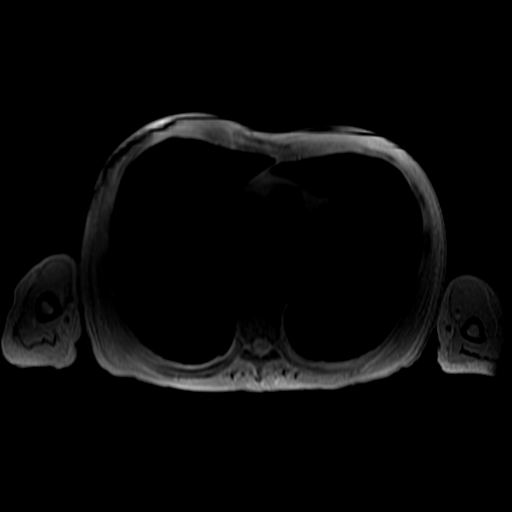

[Series 802: T1 dynamic · axial · 4.9mm · 0.82mm/px · z∈[-127,+136]mm · 3 of 106 slices shown (2 of 2)]
[im 1/106]
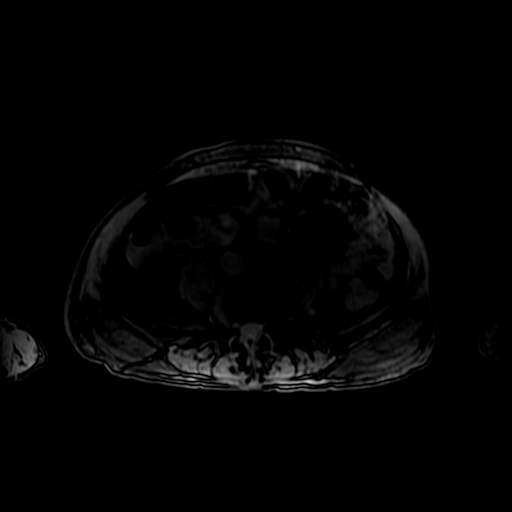
[im 53/106]
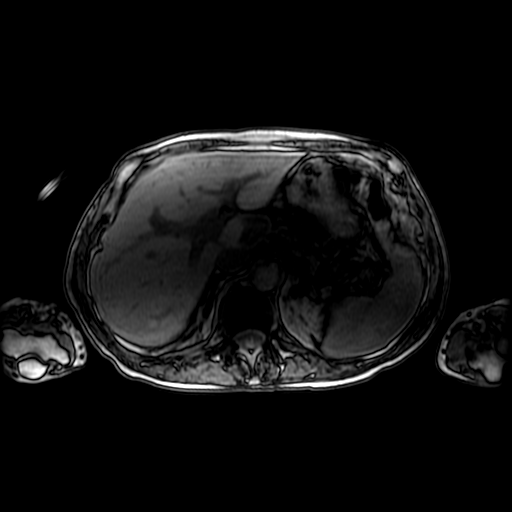
[im 106/106]
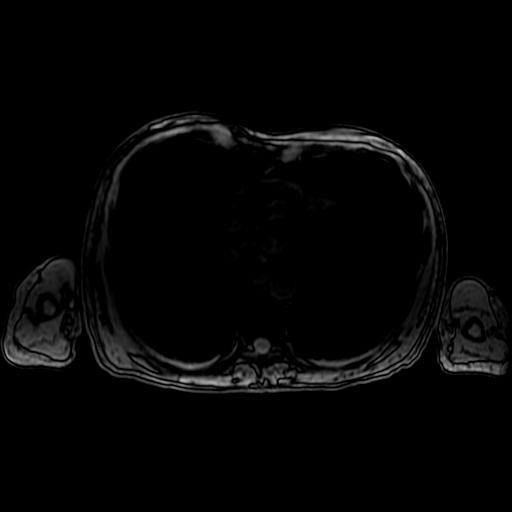

[Series 951: projection images · sagittal · 1.2mm · 0.30mm/px · 1 of 7 slices shown]
[im 1/7]
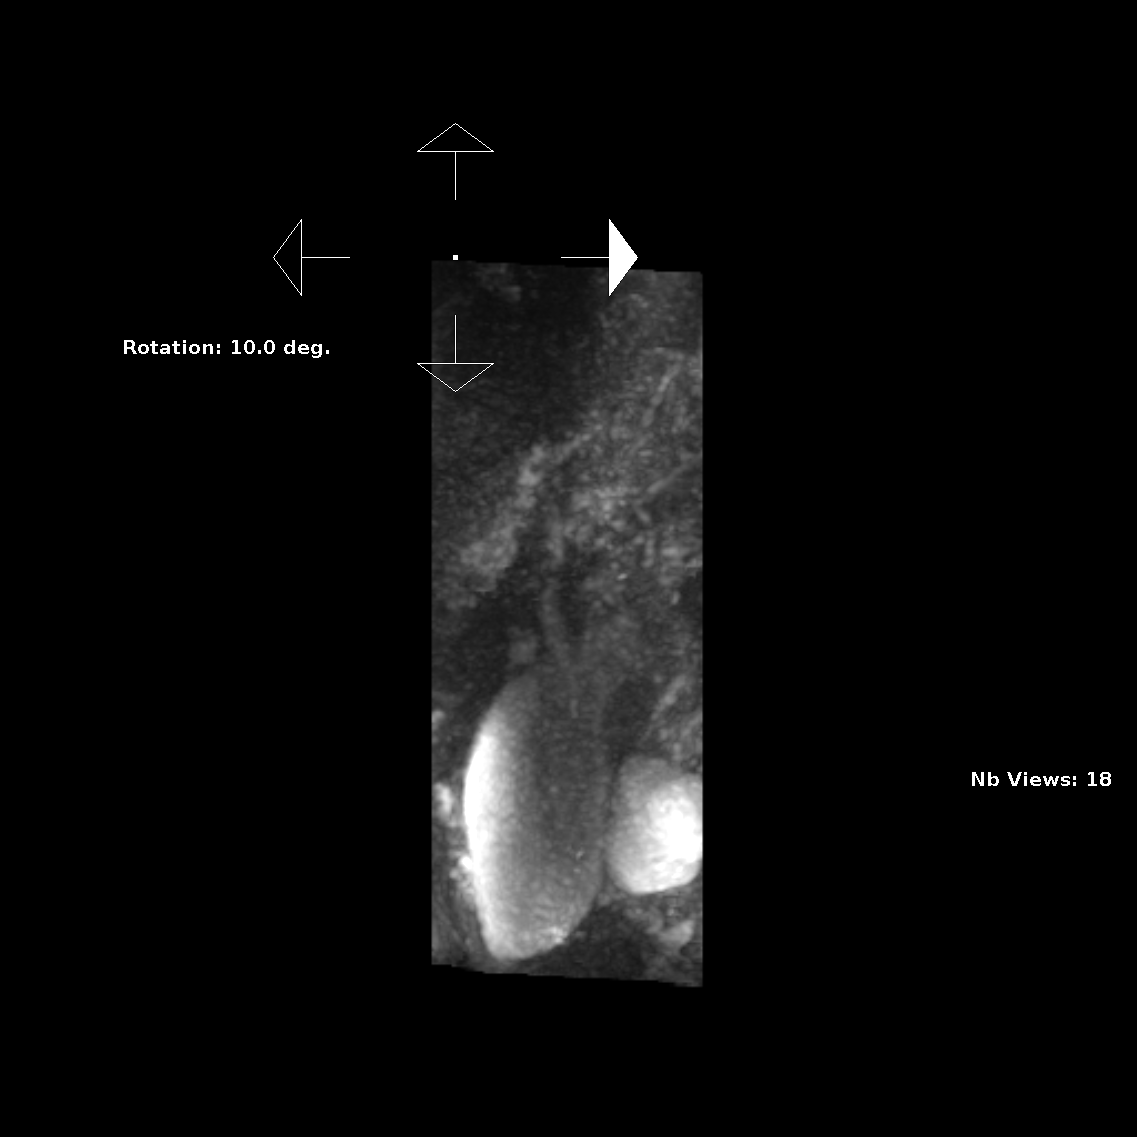

[16 of 48 positions shown; findings below may reference images not displayed]

FINDINGS: Examination limited by respiratory motion.

Lower chest: The lung bases are clear of an acute process. No
pulmonary lesions or pleural effusions.

Hepatobiliary: No hepatic lesions or intrahepatic biliary
dilatation. The gallbladder is mildly distended. No gallstones or
gallbladder wall thickening. Normal caliber and course of the common
bile duct. No definite common bile duct stones are identified.

Pancreas: Changes of diffuse interstitial pancreatitis intra and
extra pancreatic pseudocysts. These are stable. No findings for
pancreatic necrosis. No abscess or hematoma.

Spleen:  Normal size. No focal lesions.

Adrenals/Urinary Tract: Adrenal glands are unremarkable. Small
atrophic left kidney. Stable right renal cysts.

Stomach/Bowel: The stomach, duodenum, visualized small bowel and
visualized colon are grossly normal.

Vascular/Lymphatic: The aorta and branch vessels are patent. The
major venous structures are patent. Scattered mesenteric and
retroperitoneal lymph nodes are unchanged and likely
reactive/inflammatory.

Other:  Mesenteric edema and scattered fluid but no overt ascites.

Musculoskeletal: No significant bony findings.
IMPRESSION: 1. Changes of diffuse interstitial pancreatitis with intra and extra
pancreatic pseudocysts. No findings for pancreatic necrosis.
2. Normal caliber and course of the common bile duct. No definite
common bile duct stones.
3. Mild gallbladder distention but no definite gallstones.
4. No other acute abdominal findings, mass lesions or adenopathy.

## 2022-12-20 DIAGNOSIS — Z23 Encounter for immunization: Secondary | ICD-10-CM | POA: Insufficient documentation

## 2023-07-30 DIAGNOSIS — K5900 Constipation, unspecified: Secondary | ICD-10-CM | POA: Insufficient documentation

## 2023-08-08 ENCOUNTER — Other Ambulatory Visit: Payer: Self-pay | Admitting: *Deleted

## 2023-08-08 DIAGNOSIS — R109 Unspecified abdominal pain: Secondary | ICD-10-CM

## 2023-08-11 NOTE — Progress Notes (Deleted)
[facility] [place]  ASSESSMENT / PLAN: 72 y.o. male with 10mm splenic artery aneurysm. The threshold for intervention in this man is 30mm. Will see again in 1 year with CT angiogram of abdomen and pelvis. - ***  CHIEF COMPLAINT: ***  HISTORY OF PRESENT ILLNESS: Manuel Castro is a 72 y.o. male ***  VASCULAR SURGICAL HISTORY: ***  VASCULAR RISK FACTORS: {FINDINGS; POSITIVE NEGATIVE:[phone]} history of stroke / transient ischemic attack. {FINDINGS; POSITIVE NEGATIVE:[phone]} history of coronary artery disease. *** history of PCI. *** history of CABG.  {FINDINGS; POSITIVE NEGATIVE:[phone]} history of diabetes mellitus. Last A1c ***. {FINDINGS; POSITIVE NEGATIVE:[phone]} history of smoking. *** actively smoking. {FINDINGS; POSITIVE NEGATIVE:[phone]} history of hypertension. *** drug regimen with *** control. {FINDINGS; POSITIVE NEGATIVE:[phone]} history of chronic kidney disease.  Last GFR ***. CKD {stage:30421363}. {FINDINGS; POSITIVE NEGATIVE:[phone]} history of chronic obstructive pulmonary disease, treated with ***.  FUNCTIONAL STATUS: ECOG performance status: {findings; ecog performance status:31780} Ambulatory status: {TNHAmbulation:25868}  CAREY 1 AND 3 YEAR INDEX Male (2pts) 75-79 or 80-84 (2pts) >84 (3pts) Dependence in toileting (1pt) Partial or full dependence in dressing (1pt) History of malignant neoplasm (2pts) CHF (3pts) COPD (1pts) CKD (3pts)  0-3 pts 6% 1 year mortality ; 21% 3 year mortality 4-5 pts 12% 1 year mortality ; 36% 3 year mortality >5 pts 21% 1 year mortality; 54% 3 year mortality   Past Medical History:  Diagnosis Date   Chronic kidney disease    Chronic pain    b/l knees   CMT (Charcot-Marie-Tooth disease)    Depression    GERD (gastroesophageal reflux disease)    HTN (hypertension)     Past Surgical History:  Procedure Laterality Date   ADENOIDECTOMY     ESOPHAGOGASTRODUODENOSCOPY N/A  02/23/2021   Procedure: ESOPHAGOGASTRODUODENOSCOPY (EGD);  Surgeon: Charlott Rakes, MD;  Location: [facility] ENDOSCOPY;  Service: Endoscopy;  Laterality: N/A;   FOOT SURGERY Bilateral    HERNIA REPAIR     NECK SURGERY     TONSILLECTOMY      Family History  Problem Relation Age of Onset   Cancer Mother    Heart attack Mother    Diabetes Father    Heart attack Father     Social History   Socioeconomic History   Marital status: Divorced    Spouse name: Not on file   Number of children: Not on file   Years of education: Not on file   Highest education level: Not on file  Occupational History   Not on file  Tobacco Use   Smoking status: Every Day    Current packs/day: 0.35    Types: Cigarettes   Smokeless tobacco: Never  Vaping Use   Vaping status: Never Used  Substance and Sexual Activity   Alcohol use: Yes   Drug use: Not Currently   Sexual activity: Not on file  Other Topics Concern   Not on file  Social History Narrative   Not on file   Social Determinants of Health   Financial Resource Strain: Not on file  Food Insecurity: Not on file  Transportation Needs: Not on file  Physical Activity: Not on file  Stress: Not on file  Social Connections: Not on file  Intimate Partner Violence: Not on file    No Known Allergies  Current Outpatient Medications  Medication Sig Dispense Refill   dorzolamide-timolol (COSOPT) 22.3-6.8 MG/ML ophthalmic solution Place 1 drop into both eyes 2 (two) times daily.     gabapentin (NEURONTIN) 600 MG tablet Take 600  mg by mouth 3 (three) times daily.     lipase/protease/amylase 24000-76000 units CPEP Take 1 capsule (24,000 Units total) by mouth 3 (three) times daily with meals. 270 capsule 1   ondansetron (ZOFRAN-ODT) 4 MG disintegrating tablet Take 4 mg by mouth every 8 (eight) hours as needed for nausea or vomiting (DISSOLVE ORALLY).     pantoprazole (PROTONIX) 40 MG tablet Take 1 tablet (40 mg total) by mouth daily. 30 tablet 1    terazosin (HYTRIN) 5 MG capsule Take 5 mg by mouth at bedtime.     traMADol (ULTRAM) 50 MG tablet Take 50 mg by mouth 3 (three) times daily.     traZODone (DESYREL) 100 MG tablet Take 100 mg by mouth at bedtime.     No current facility-administered medications for this visit.    PHYSICAL EXAM There were no vitals filed for this visit.  Constitutional: *** appearing. *** distress. Appears *** nourished.  Neurologic: CN ***. *** focal findings. *** sensory loss. Psychiatric: *** Mood and affect symmetric and appropriate. Eyes: *** No icterus. No conjunctival pallor. Ears, nose, throat: *** mucous membranes moist. Midline trachea.  Cardiac: *** rate and rhythm.  Respiratory: *** unlabored. Abdominal: *** soft, non-tender, non-distended.  Peripheral vascular: *** Extremity: *** edema. *** cyanosis. *** pallor.  Skin: *** gangrene. *** ulceration.  Lymphatic: *** Stemmer's sign. *** palpable lymphadenopathy.    PERTINENT LABORATORY AND RADIOLOGIC DATA  Most recent CBC    Latest Ref Rng & Units 02/25/2021   12:41 AM 02/24/2021   12:45 AM 02/23/2021    3:17 AM  CBC  WBC 4.0 - 10.5 K/uL 6.2  7.0  7.1   Hemoglobin 13.0 - 17.0 g/dL 8.2  8.4  8.7   Hematocrit 39.0 - 52.0 % 24.5  25.6  25.4   Platelets 150 - 400 K/uL 377  428  410      Most recent CMP    Latest Ref Rng & Units 02/25/2021   12:41 AM 02/24/2021   12:45 AM 02/23/2021    3:17 AM  CMP  Glucose 70 - 99 mg/dL 93  76  94   BUN 8 - 23 mg/dL 8  12  14    Creatinine 0.61 - 1.24 mg/dL 4.09  8.11  9.14   Sodium 135 - 145 mmol/L 139  138  140   Potassium 3.5 - 5.1 mmol/L 3.2  3.1  4.2   Chloride 98 - 111 mmol/L 109  109  108   CO2 22 - 32 mmol/L 23  19  23    Calcium 8.9 - 10.3 mg/dL 7.7  7.7  7.9     Renal function CrCl cannot be calculated (Patient's most recent lab result is older than the maximum 21 days allowed.).  No results found for: "HGBA1C"  No results found for: "LDLCALC", "LDLC", "HIRISKLDL", "POCLDL",  "LDLDIRECT", "REALLDLC", "TOTLDLC"   Vascular Imaging: ***  Jassmin Kemmerer N. Lenell Antu, MD FACS [facility] Phone Number: [phone] 08/11/2023 8:07 AM   Total time spent on preparing this encounter including chart review, data review, collecting history, examining the patient, coordinating care for this {tnhtimebilling:26202}  Portions of this report may have been transcribed using voice recognition software.  Every effort has been made to ensure accuracy; however, inadvertent computerized transcription errors may still be present.

## 2023-08-12 ENCOUNTER — Ambulatory Visit (HOSPITAL_COMMUNITY): Payer: Medicare HMO

## 2023-08-12 ENCOUNTER — Encounter: Payer: Medicare HMO | Admitting: Vascular Surgery

## 2023-08-21 ENCOUNTER — Encounter: Payer: Medicare HMO | Admitting: Vascular Surgery

## 2023-08-21 ENCOUNTER — Encounter (HOSPITAL_COMMUNITY): Payer: Self-pay

## 2023-08-21 ENCOUNTER — Ambulatory Visit (HOSPITAL_COMMUNITY): Admission: RE | Admit: 2023-08-21 | Payer: Medicare HMO | Source: Ambulatory Visit

## 2023-09-24 NOTE — Progress Notes (Unsigned)
Patient name: Manuel Castro MRN: [id number] DOB: [birth year] Sex: male  REASON FOR CONSULT: Splenic artery aneurysm  HPI: Manuel Castro is a 72 y.o. male, with history of CMT and hypertension that presents for evaluation of splenic artery aneurysm.  It appears the splenic artery aneurysm was most recently seen on a CT from 06/05/2021 at 1 cm.  This CT was to evaluate his pancreas.  He states he had no prior knowledge of his splenic artery aneurysm noted on that CT until it was recently mentioned to him.  Of note this splenic aneurysm in 2022 was imaged several times on CT and stable multiple times.  He has chronic back pain but no other abdominal symptoms.  Past Medical History:  Diagnosis Date   Chronic kidney disease    Chronic pain    b/l knees   CMT (Charcot-Marie-Tooth disease)    Depression    GERD (gastroesophageal reflux disease)    HTN (hypertension)     Past Surgical History:  Procedure Laterality Date   ADENOIDECTOMY     ESOPHAGOGASTRODUODENOSCOPY N/A 02/23/2021   Procedure: ESOPHAGOGASTRODUODENOSCOPY (EGD);  Surgeon: Charlott Rakes, MD;  Location: [facility] ENDOSCOPY;  Service: Endoscopy;  Laterality: N/A;   FOOT SURGERY Bilateral    HERNIA REPAIR     NECK SURGERY     TONSILLECTOMY      Family History  Problem Relation Age of Onset   Cancer Mother    Heart attack Mother    Diabetes Father    Heart attack Father     SOCIAL HISTORY: Social History   Socioeconomic History   Marital status: Divorced    Spouse name: Not on file   Number of children: Not on file   Years of education: Not on file   Highest education level: Not on file  Occupational History   Not on file  Tobacco Use   Smoking status: Every Day    Current packs/day: 0.35    Types: Cigarettes   Smokeless tobacco: Never  Vaping Use   Vaping status: Never Used  Substance and Sexual Activity   Alcohol use: Yes   Drug use: Not Currently   Sexual activity: Not on file  Other Topics Concern   Not  on file  Social History Narrative   Not on file   Social Determinants of Health   Financial Resource Strain: Not on file  Food Insecurity: Not on file  Transportation Needs: Not on file  Physical Activity: Not on file  Stress: Not on file  Social Connections: Not on file  Intimate Partner Violence: Not on file    No Known Allergies  Current Outpatient Medications  Medication Sig Dispense Refill   dorzolamide-timolol (COSOPT) 22.3-6.8 MG/ML ophthalmic solution Place 1 drop into both eyes 2 (two) times daily.     gabapentin (NEURONTIN) 600 MG tablet Take 600 mg by mouth 3 (three) times daily.     lipase/protease/amylase 24000-76000 units CPEP Take 1 capsule (24,000 Units total) by mouth 3 (three) times daily with meals. 270 capsule 1   ondansetron (ZOFRAN-ODT) 4 MG disintegrating tablet Take 4 mg by mouth every 8 (eight) hours as needed for nausea or vomiting (DISSOLVE ORALLY).     pantoprazole (PROTONIX) 40 MG tablet Take 1 tablet (40 mg total) by mouth daily. 30 tablet 1   terazosin (HYTRIN) 5 MG capsule Take 5 mg by mouth at bedtime.     traMADol (ULTRAM) 50 MG tablet Take 50 mg by mouth 3 (three) times daily.  traZODone (DESYREL) 100 MG tablet Take 100 mg by mouth at bedtime.     No current facility-administered medications for this visit.    REVIEW OF SYSTEMS:  [X]  denotes positive finding, [ ]  denotes negative finding Cardiac  Comments:  Chest pain or chest pressure:    Shortness of breath upon exertion:    Short of breath when lying flat:    Irregular heart rhythm:        Vascular    Pain in calf, thigh, or hip brought on by ambulation:    Pain in feet at night that wakes you up from your sleep:     Blood clot in your veins:    Leg swelling:         Pulmonary    Oxygen at home:    Productive cough:     Wheezing:         Neurologic    Sudden weakness in arms or legs:     Sudden numbness in arms or legs:     Sudden onset of difficulty speaking or slurred  speech:    Temporary loss of vision in one eye:     Problems with dizziness:         Gastrointestinal    Blood in stool:     Vomited blood:         Genitourinary    Burning when urinating:     Blood in urine:        Psychiatric    Major depression:         Hematologic    Bleeding problems:    Problems with blood clotting too easily:        Skin    Rashes or ulcers:        Constitutional    Fever or chills:      PHYSICAL EXAM: There were no vitals filed for this visit.  GENERAL: The patient is a well-nourished male, in no acute distress. The vital signs are documented above. CARDIAC: There is a regular rate and rhythm.  VASCULAR:  Palpable femoral pulses bilaterally Palpable PT pulses bilaterally PULMONARY: No respiratory distress. ABDOMEN: Soft and non-tender. MUSCULOSKELETAL: There are no major deformities or cyanosis. NEUROLOGIC: No focal weakness or paresthesias are detected. SKIN: There are no ulcers or rashes noted. PSYCHIATRIC: The patient has a normal affect.  DATA:   Mesenteric duplex today shows splenic artery aneurysm with maximal diameter 0.93 cm  CT abdomen reviewed from 06/05/2021 with approximately 1 cm splenic artery aneurysm with calcification  Assessment/Plan:  72 y.o. male, with history of CMT and hypertension that presents for evaluation of splenic artery aneurysm.  Discussed that this was an incidental finding on CT from 2022.  His splenic artery aneurysm measured approximately 1 cm when it was identified on a CT in 2022.  I discussed there is no indication for surgical intervention.  Repeat imaging today shows no growth of the aneurysm still measuring approximately 1 cm.  I discussed current recommendations from our society would be repairing splenic aneurysms at greater than 3 cm and all sizes in women of childbearing age.  I will arrange follow-up again in 1 year for ongoing surveillance with repeat mesenteric duplex.   Manuel Shelling,  MD [facility] [place] Office: [phone]

## 2023-09-25 ENCOUNTER — Ambulatory Visit: Payer: Medicare HMO | Admitting: Vascular Surgery

## 2023-09-25 ENCOUNTER — Encounter: Payer: Self-pay | Admitting: Vascular Surgery

## 2023-09-25 ENCOUNTER — Ambulatory Visit (HOSPITAL_COMMUNITY)
Admission: RE | Admit: 2023-09-25 | Discharge: 2023-09-25 | Disposition: A | Discharge status: Home / Self Care | Payer: Medicare HMO | Source: Ambulatory Visit | Attending: Vascular Surgery | Admitting: Vascular Surgery

## 2023-09-25 VITALS — BP 192/101 | HR 65 | Temp 98.0°F | Ht 68.0 in | Wt 138.1 lb

## 2023-09-25 DIAGNOSIS — I728 Aneurysm of other specified arteries: Secondary | ICD-10-CM | POA: Diagnosis not present

## 2023-09-25 DIAGNOSIS — R109 Unspecified abdominal pain: Secondary | ICD-10-CM | POA: Insufficient documentation

## 2023-10-03 ENCOUNTER — Other Ambulatory Visit: Payer: Self-pay | Admitting: Medical Genetics

## 2023-10-03 DIAGNOSIS — Z006 Encounter for examination for normal comparison and control in clinical research program: Secondary | ICD-10-CM

## 2023-10-04 ENCOUNTER — Other Ambulatory Visit: Payer: Self-pay

## 2023-10-04 DIAGNOSIS — I728 Aneurysm of other specified arteries: Secondary | ICD-10-CM

## 2023-11-08 ENCOUNTER — Other Ambulatory Visit (HOSPITAL_COMMUNITY)
Admission: RE | Admit: 2023-11-08 | Discharge: 2023-11-08 | Disposition: A | Discharge status: Home / Self Care | Payer: Medicare HMO | Source: Ambulatory Visit | Attending: Oncology | Admitting: Oncology

## 2023-11-08 DIAGNOSIS — Z006 Encounter for examination for normal comparison and control in clinical research program: Secondary | ICD-10-CM | POA: Insufficient documentation

## 2023-11-25 LAB — GENECONNECT MOLECULAR SCREEN: Genetic Analysis Overall Interpretation: NEGATIVE

## 2023-12-28 DIAGNOSIS — M25561 Pain in right knee: Secondary | ICD-10-CM | POA: Insufficient documentation

## 2023-12-28 DIAGNOSIS — F1090 Alcohol use, unspecified, uncomplicated: Secondary | ICD-10-CM | POA: Insufficient documentation

## 2024-05-04 ENCOUNTER — Encounter: Payer: Self-pay | Admitting: Physician Assistant

## 2024-05-04 ENCOUNTER — Ambulatory Visit (INDEPENDENT_AMBULATORY_CARE_PROVIDER_SITE_OTHER): Admitting: Physician Assistant

## 2024-05-04 VITALS — BP 212/102 | HR 85 | Temp 98.8°F | Ht 67.0 in | Wt 134.8 lb

## 2024-05-04 DIAGNOSIS — N1831 Chronic kidney disease, stage 3a: Secondary | ICD-10-CM | POA: Diagnosis not present

## 2024-05-04 DIAGNOSIS — I10 Essential (primary) hypertension: Secondary | ICD-10-CM | POA: Diagnosis not present

## 2024-05-04 DIAGNOSIS — R296 Repeated falls: Secondary | ICD-10-CM | POA: Insufficient documentation

## 2024-05-04 DIAGNOSIS — R0989 Other specified symptoms and signs involving the circulatory and respiratory systems: Secondary | ICD-10-CM | POA: Insufficient documentation

## 2024-05-04 DIAGNOSIS — Z125 Encounter for screening for malignant neoplasm of prostate: Secondary | ICD-10-CM

## 2024-05-04 DIAGNOSIS — Z131 Encounter for screening for diabetes mellitus: Secondary | ICD-10-CM | POA: Diagnosis not present

## 2024-05-04 DIAGNOSIS — M545 Low back pain, unspecified: Secondary | ICD-10-CM | POA: Insufficient documentation

## 2024-05-04 DIAGNOSIS — M51361 Other intervertebral disc degeneration, lumbar region with lower extremity pain only: Secondary | ICD-10-CM

## 2024-05-04 DIAGNOSIS — Z9181 History of falling: Secondary | ICD-10-CM | POA: Diagnosis not present

## 2024-05-04 DIAGNOSIS — I728 Aneurysm of other specified arteries: Secondary | ICD-10-CM

## 2024-05-04 DIAGNOSIS — G479 Sleep disorder, unspecified: Secondary | ICD-10-CM | POA: Insufficient documentation

## 2024-05-04 DIAGNOSIS — M810 Age-related osteoporosis without current pathological fracture: Secondary | ICD-10-CM | POA: Insufficient documentation

## 2024-05-04 DIAGNOSIS — Z13220 Encounter for screening for lipoid disorders: Secondary | ICD-10-CM | POA: Diagnosis not present

## 2024-05-04 DIAGNOSIS — G600 Hereditary motor and sensory neuropathy: Secondary | ICD-10-CM | POA: Insufficient documentation

## 2024-05-04 DIAGNOSIS — G894 Chronic pain syndrome: Secondary | ICD-10-CM

## 2024-05-04 DIAGNOSIS — R12 Heartburn: Secondary | ICD-10-CM | POA: Insufficient documentation

## 2024-05-04 DIAGNOSIS — E785 Hyperlipidemia, unspecified: Secondary | ICD-10-CM | POA: Insufficient documentation

## 2024-05-04 DIAGNOSIS — K861 Other chronic pancreatitis: Secondary | ICD-10-CM

## 2024-05-04 DIAGNOSIS — E611 Iron deficiency: Secondary | ICD-10-CM

## 2024-05-04 LAB — CBC WITH DIFFERENTIAL/PLATELET
Basophils Absolute: 0 10*3/uL (ref 0.0–0.1)
Basophils Relative: 1.3 % (ref 0.0–3.0)
Eosinophils Absolute: 0.2 10*3/uL (ref 0.0–0.7)
Eosinophils Relative: 4.7 % (ref 0.0–5.0)
HCT: 37.1 % — ABNORMAL LOW (ref 39.0–52.0)
Hemoglobin: 12.6 g/dL — ABNORMAL LOW (ref 13.0–17.0)
Lymphocytes Relative: 33 % (ref 12.0–46.0)
Lymphs Abs: 1.1 10*3/uL (ref 0.7–4.0)
MCHC: 34 g/dL (ref 30.0–36.0)
MCV: 94.9 fl (ref 78.0–100.0)
Monocytes Absolute: 0.2 10*3/uL (ref 0.1–1.0)
Monocytes Relative: 7.3 % (ref 3.0–12.0)
Neutro Abs: 1.8 10*3/uL (ref 1.4–7.7)
Neutrophils Relative %: 53.7 % (ref 43.0–77.0)
Platelets: 186 10*3/uL (ref 150.0–400.0)
RBC: 3.91 Mil/uL — ABNORMAL LOW (ref 4.22–5.81)
RDW: 12.8 % (ref 11.5–15.5)
WBC: 3.3 10*3/uL — ABNORMAL LOW (ref 4.0–10.5)

## 2024-05-04 LAB — MICROALBUMIN / CREATININE URINE RATIO
Creatinine,U: 66.2 mg/dL
Microalb Creat Ratio: 81.6 mg/g — ABNORMAL HIGH (ref 0.0–30.0)
Microalb, Ur: 5.4 mg/dL — ABNORMAL HIGH (ref 0.0–1.9)

## 2024-05-04 LAB — COMPREHENSIVE METABOLIC PANEL WITH GFR
ALT: 14 U/L (ref 0–53)
AST: 43 U/L — ABNORMAL HIGH (ref 0–37)
Albumin: 3.9 g/dL (ref 3.5–5.2)
Alkaline Phosphatase: 97 U/L (ref 39–117)
BUN: 11 mg/dL (ref 6–23)
CO2: 30 meq/L (ref 19–32)
Calcium: 9.3 mg/dL (ref 8.4–10.5)
Chloride: 101 meq/L (ref 96–112)
Creatinine, Ser: 1.48 mg/dL (ref 0.40–1.50)
GFR: 46.88 mL/min — ABNORMAL LOW (ref >60.00)
Glucose, Bld: 94 mg/dL (ref 70–99)
Potassium: 5.1 meq/L (ref 3.5–5.1)
Sodium: 138 meq/L (ref 135–145)
Total Bilirubin: 0.5 mg/dL (ref 0.2–1.2)
Total Protein: 6.3 g/dL (ref 6.0–8.3)

## 2024-05-04 LAB — LIPID PANEL
Cholesterol: 227 mg/dL — ABNORMAL HIGH (ref 0–200)
HDL: 68 mg/dL (ref >39.00)
LDL Cholesterol: 130 mg/dL — ABNORMAL HIGH (ref 0–99)
NonHDL: 158.5
Total CHOL/HDL Ratio: 3
Triglycerides: 144 mg/dL (ref 0.0–149.0)
VLDL: 28.8 mg/dL (ref 0.0–40.0)

## 2024-05-04 LAB — TSH: TSH: 0.95 u[IU]/mL (ref 0.35–5.50)

## 2024-05-04 LAB — HEMOGLOBIN A1C: Hgb A1c MFr Bld: 5.4 % (ref 4.6–6.5)

## 2024-05-04 LAB — PSA: PSA: 0.91 ng/mL (ref 0.10–4.00)

## 2024-05-04 NOTE — Telephone Encounter (Signed)
 FYI from patient on GI provider seen in the past

## 2024-05-04 NOTE — Patient Instructions (Signed)
  VISIT SUMMARY: Today, you came in to establish care after your previous clinic closed. 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 We also reviewed your general health maintenance needs.  YOUR PLAN: CHARCOT-MARIE-TOOTH DISEASE (CMT): You have a chronic condition causing pain, muscle weakness, and occasional falls. You are currently on tramadol for pain management, which is becoming less effective. -Continue taking tramadol 50 mg three times daily. -We will perform a urine drug screen to ensure compliance with your current medications. -You will be referred to Dr. Megan Lovorn for a physical medicine and rehabilitation consultation. -A prescription for forearm crutches will be provided to assist with your mobility. -We discussed Jornamax as a potential future non-opioid pain reliever.  HYPERTENSION: Your blood pressure readings at home are elevated, possibly influenced by your medication and anxiety during medical visits. -We will recheck your blood pressure before you leave the office today. -Comprehensive blood work including CBC, CMP, A1c, lipid panel, PSA, and TSH will be ordered to assess your overall health and potential secondary causes of hypertension.  CHRONIC KIDNEY DISEASE (CKD): You have a history of kidney stones and only one kidney, with no current nephrology follow-up since moving to the area. -We will consider a referral to nephrology for evaluation and management of your kidney function.  GENERAL HEALTH MAINTENANCE: You need routine health maintenance tests and continuity of care after your previous clinic closed. -We will order PSA and TSH tests as part of your routine health maintenance. -We will attempt to obtain your previous medical records from [facility] for continuity of care.                      Contains text generated by Abridge.                                  Contains text generated by Abridge.

## 2024-05-04 NOTE — Progress Notes (Signed)
 Patient ID: Manuel Castro, male    DOB: 1950/12/06, 73 y.o.   MRN: [id number]   Assessment & Plan:  Charcot-Marie-Tooth disease -     For home use only DME Other see comment -     CBC with Differential/Platelet -     Comprehensive metabolic panel with GFR -     Lipid panel -     TSH -     Hemoglobin A1c -     PSA -     DRUG MONITORING, PANEL 8 WITH CONFIRMATION, URINE  Primary hypertension -     CBC with Differential/Platelet -     Comprehensive metabolic panel with GFR -     Lipid panel -     TSH -     Hemoglobin A1c -     PSA -     Microalbumin / creatinine urine ratio  Splenic artery aneurysm (HCC)  Degeneration of intervertebral disc of lumbar region with lower extremity pain -     For home use only DME Other see comment -     DRUG MONITORING, PANEL 8 WITH CONFIRMATION, URINE  At risk for falls -     For home use only DME Other see comment -     DRUG MONITORING, PANEL 8 WITH CONFIRMATION, URINE  Chronic pain syndrome -     CBC with Differential/Platelet -     Comprehensive metabolic panel with GFR -     Lipid panel -     TSH -     Hemoglobin A1c -     PSA -     DRUG MONITORING, PANEL 8 WITH CONFIRMATION, URINE  Stage 3a chronic kidney disease (HCC) -     Comprehensive metabolic panel with GFR -     Microalbumin / creatinine urine ratio  Other chronic pancreatitis (HCC) -     CBC with Differential/Platelet -     Comprehensive metabolic panel with GFR  Iron deficiency -     CBC with Differential/Platelet  Screening for cholesterol level -     Lipid panel  Screening for prostate cancer -     PSA  Diabetes mellitus screening -     Comprehensive metabolic panel with GFR -     Hemoglobin A1c      Assessment and Plan Assessment & Plan Charcot-Marie-Tooth Disease (CMT) Chronic condition with symptoms of pain from toes to above hips, muscle weakness, and occasional falls. 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 Pain management is challenging due to previous opioid dependency. Currently on tramadol, which is becoming less effective. Interested in non-opioid pain relievers like Journavx to avoid opioid dependency. Previous pain management interventions such as nerve bundle ablation were costly and ineffective long-term. - Continue tramadol 50 mg three times daily & Gabapentin  600 mg TID - Perform urine drug screen to ensure compliance with current medications. - Refer to Dr. Megan Lovorn for physical medicine and rehabilitation consultation. - Provide prescription for forearm crutches to assist with mobility. - PDMP reviewed today, no red flags, filling appropriately.    Hypertension Urgent BP reading, pt asymptomatic on exam. No signs end-organ damage on exam today. He says normal readings at home.  Blood pressure readings at home are typically 130-140/82-83 mmHg, which is elevated. Pt thinks BP is influenced by Creon  medication and anxiety during medical visits. Experiences white coat syndrome, contributing to elevated readings in clinical settings. - Recheck blood pressure before leaving  the office. - Order comprehensive blood work including CBC, CMP, A1c, lipid panel, PSA, and TSH to assess overall health and potential secondary causes of hypertension.  Chronic Kidney Disease (CKD) Has only one kidney and previous kidney stones. No current nephrology follow-up since moving to the area. Previous providers did not consider nephrology referral necessary. - Consider referral to nephrology for evaluation and management of kidney function.  General Health Maintenance No recent prostate-specific antigen (PSA) test or thyroid function test documented. No recent blood work since the closure of previous medical provider. - Order PSA and TSH as part of routine health maintenance. - Attempt to obtain previous medical records from [facility] for continuity of care.   [phone] - phone  number to [facility], last PCP    Return in about 2 weeks (around 05/18/2024) for recheck/follow-up, blood pressure check.    Subjective:    Chief Complaint  Patient presents with   New Patient (Initial Visit)    New Pt in office to est care with PCP;     HPI Discussed the use of AI scribe software for clinical note transcription with the patient, who gave verbal consent to proceed.  History of Present Illness   Manuel Castro is a 73 year old male with Charcot-Marie-Tooth disease who presents to establish care after his previous clinic closed.  He has Charcot-Marie-Tooth disease, causing significant pain from his toes to above his hips, muscle weakness, and occasional falls. He uses a cane for mobility and is interested in forearm crutches for better stability. He has undergone midfoot osteotomies and toe surgeries to correct walking issues.  He has a history of being on multiple medications, including fentanyl, oxycontin , Vicodin, and Klonopin, due to various health issues such as bleeding ulcers and kidney stones. Approximately 18 years ago, he stopped all these medications, leading to a difficult withdrawal period lasting about six months. He currently takes tramadol 50 mg three times a day to manage his leg pain and gabapentin  600 mg three times a day.  He has been diagnosed with missing three discs in his lower back and has undergone a laminectomy in his neck due to nodules causing arm numbness. His leg pain and muscle weakness are the most bothersome symptoms, leading to falls.  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 He is followed by a gastroenterologist for his gastrointestinal issues and has seen vascular specialists for his aneurysm.  He does not drive due to his inability to feel his feet and uses public transportation services like [employer] and [employer] for mobility. He has a history of smoking and  experiences sinus issues, which he attributes to allergies.  He monitors his blood pressure at home, which he reports as being higher than usual, possibly due to medication effects. He has not seen a nephrologist since moving to the area but has been followed by a vascular doctor recently.     Past Medical History:  Diagnosis Date   Allergy 1957   bee stings   Arthritis 1983   Blood transfusion without reported diagnosis 2022   Cataract 2024   Chronic kidney disease    Chronic pain    b/l knees   CMT (Charcot-Marie-Tooth disease)    Depression    GERD (gastroesophageal reflux disease)    Glaucoma 1970   controlled by eye drops   HTN (hypertension)    Ulcer 1999   bleeding ulcers - cauterized  Past Surgical History:  Procedure Laterality Date   ADENOIDECTOMY     ESOPHAGOGASTRODUODENOSCOPY N/A 02/23/2021   Procedure: ESOPHAGOGASTRODUODENOSCOPY (EGD);  Surgeon: Dianna Specking, MD;  Location: [facility] ENDOSCOPY;  Service: Endoscopy;  Laterality: N/A;   FOOT SURGERY Bilateral    FRACTURE SURGERY  8016,8014   feet   HERNIA REPAIR     NECK SURGERY     TONSILLECTOMY      Family History  Problem Relation Age of Onset   Cancer Mother    Heart attack Mother    Heart disease Mother    Diabetes Father    Heart attack Father    Heart disease Father     Social History   Tobacco Use   Smoking status: Every Day    Current packs/day: 0.25    Average packs/day: 0.3 packs/day for 10.0 years (2.5 ttl pk-yrs)    Types: Cigarettes   Smokeless tobacco: Never  Vaping Use   Vaping status: Never Used  Substance Use Topics   Alcohol use: Not Currently   Drug use: Not Currently    Types: Marijuana     Allergies  Allergen Reactions   Bee Venom Shortness Of Breath and Swelling   Amlodipine  Besylate Other (See Comments)   Aspirin     Other Reaction(s): bleeds easily   Lisinopril      Review of Systems NEGATIVE UNLESS OTHERWISE INDICATED IN HPI      Objective:     BP  (!) 212/102 (BP Location: Left Arm, Patient Position: Sitting, Cuff Size: Normal)   Pulse 85   Temp 98.8 F (37.1 C) (Temporal)   Ht 5' 7 (1.702 m)   Wt 134 lb 12.8 oz (61.1 kg)   SpO2 97%   BMI 21.11 kg/m   Wt Readings from Last 3 Encounters:  05/04/24 134 lb 12.8 oz (61.1 kg)  09/25/23 138 lb 1.6 oz (62.6 kg)  02/22/21 123 lb 7.3 oz (56 kg)    BP Readings from Last 3 Encounters:  05/04/24 (!) 212/102  09/25/23 (!) 192/101  03/14/21 (!) 144/68     Physical Exam Vitals and nursing note reviewed.  Constitutional:      General: He is not in acute distress.    Appearance: Normal appearance. He is not toxic-appearing.  HENT:     Head: Normocephalic and atraumatic.     Right Ear: External ear normal.     Left Ear: External ear normal.   Eyes:     Extraocular Movements: Extraocular movements intact.     Conjunctiva/sclera: Conjunctivae normal.     Pupils: Pupils are equal, round, and reactive to light.    Cardiovascular:     Rate and Rhythm: Normal rate and regular rhythm.     Pulses: Normal pulses.     Heart sounds: Normal heart sounds.  Pulmonary:     Effort: Pulmonary effort is normal.     Breath sounds: Wheezing (faint diffuse expiratory wheezing) present.  Abdominal:     General: Bowel sounds are normal.   Musculoskeletal:        General: Deformity present. Normal range of motion.     Cervical back: Normal range of motion and neck supple.   Skin:    General: Skin is warm and dry.   Neurological:     Mental Status: He is alert and oriented to person, place, and time.     Cranial Nerves: No cranial nerve deficit.     Motor: Weakness (bilateral lower extremities; wearing braces on each leg) present.  Gait: Gait abnormal.     Comments: Normal grip strength bilateral upper extremities  Psychiatric:        Mood and Affect: Mood normal.            Time Spent: 65 minutes of total time was spent on the date of the encounter performing the following  actions: chart review prior to seeing the patient, obtaining history, performing a medically necessary exam, counseling on the treatment plan, placing orders, and documenting in our EHR.     Ibraheem Voris M Alitza Cowman, PA-C

## 2024-05-05 LAB — DRUG MONITORING, PANEL 8 WITH CONFIRMATION, URINE
6 Acetylmorphine: NEGATIVE ng/mL (ref <10)
Alcohol Metabolites: NEGATIVE ng/mL (ref <500)
Amphetamines: NEGATIVE ng/mL (ref <500)
Benzodiazepines: NEGATIVE ng/mL (ref <100)
Buprenorphine, Urine: NEGATIVE ng/mL (ref <5)
Cocaine Metabolite: NEGATIVE ng/mL (ref <150)
Creatinine: 64.8 mg/dL (ref >=20.0)
MDMA: NEGATIVE ng/mL (ref <500)
Marijuana Metabolite: NEGATIVE ng/mL (ref <20)
Opiates: NEGATIVE ng/mL (ref <100)
Oxidant: NEGATIVE ug/mL (ref <200)
Oxycodone: NEGATIVE ng/mL (ref <100)
pH: 7.7 (ref 4.5–9.0)

## 2024-05-05 LAB — DM TEMPLATE

## 2024-05-12 ENCOUNTER — Ambulatory Visit: Payer: Self-pay | Admitting: Physician Assistant

## 2024-05-19 ENCOUNTER — Encounter: Payer: Self-pay | Admitting: Physician Assistant

## 2024-05-19 ENCOUNTER — Ambulatory Visit (INDEPENDENT_AMBULATORY_CARE_PROVIDER_SITE_OTHER): Admitting: Physician Assistant

## 2024-05-19 VITALS — BP 192/90 | HR 77 | Temp 98.1°F | Ht 67.0 in | Wt 134.6 lb

## 2024-05-19 DIAGNOSIS — G894 Chronic pain syndrome: Secondary | ICD-10-CM | POA: Diagnosis not present

## 2024-05-19 DIAGNOSIS — N1831 Chronic kidney disease, stage 3a: Secondary | ICD-10-CM

## 2024-05-19 DIAGNOSIS — I10 Essential (primary) hypertension: Secondary | ICD-10-CM

## 2024-05-19 DIAGNOSIS — K861 Other chronic pancreatitis: Secondary | ICD-10-CM

## 2024-05-19 DIAGNOSIS — N261 Atrophy of kidney (terminal): Secondary | ICD-10-CM

## 2024-05-19 DIAGNOSIS — G600 Hereditary motor and sensory neuropathy: Secondary | ICD-10-CM | POA: Diagnosis not present

## 2024-05-19 DIAGNOSIS — I728 Aneurysm of other specified arteries: Secondary | ICD-10-CM

## 2024-05-19 MED ORDER — TELMISARTAN 20 MG PO TABS: 20.0000 mg | ORAL_TABLET | Freq: Every day | ORAL | 0 refills | Status: DC

## 2024-05-19 NOTE — Patient Instructions (Signed)
  VISIT SUMMARY: Today, we discussed the management of your chronic conditions, including chronic pain, high blood pressure, left renal atrophy, chronic pancreatitis, and hyperlipidemia. We also reviewed your splenic artery aneurysm and general health maintenance.  YOUR PLAN: CHRONIC PAIN: Your chronic pain is due to Charcot-Marie-Tooth disease and is currently managed with tramadol and gabapentin . -Continue taking tramadol 50 mg three times daily. -Continue taking gabapentin  600 mg three times daily.  HYPERTENSION: Your high blood pressure is complicated by 'white coat syndrome' and medication intolerance. -We will refer you to [facility] for a nephrology consultation. -We will discuss potential new blood pressure medications with the supervising physician. -Avoid NSAIDs such as aspirin, Aleve, and ibuprofen.  LEFT RENAL ATROPHY: You have severe left renal atrophy with compensatory function by your right kidney. -We will refer you to [facility] for a nephrology consultation. -We will monitor your kidney function through regular lab tests.  CHRONIC PANCREATITIS: Your chronic pancreatitis is managed by avoiding known triggers and maintaining dietary restrictions. -Continue to avoid known dietary and medication triggers.  SPLENIC ARTERY ANEURYSM: Your splenic artery aneurysm is under surveillance with no growth observed. -Continue annual follow-up with vascular surgery.  HYPERLIPIDEMIA: You have elevated LDL cholesterol and we discussed dietary modifications. -Encourage dietary modifications to lower LDL cholesterol, such as increasing intake of flaxseed, fiber, and fish. -Monitor your lipid levels regularly.  GENERAL HEALTH MAINTENANCE: Your low red and white blood cell counts are well-managed, and you maintain regular vaccinations. -Continue regular vaccinations. -Monitor blood cell counts periodically.  FOLLOW-UP: We will reassess your medication needs  and monitor your chronic conditions. -Schedule a follow-up appointment in 8 weeks. -Communicate via MyChart for any interim concerns or medication adjustments.                      Contains text generated by Abridge.                                 Contains text generated by Abridge.

## 2024-05-19 NOTE — Progress Notes (Signed)
 Patient ID: Manuel Castro, male    DOB: Oct 04, 1951, 73 y.o.   MRN: [id number]   Assessment & Plan:  Charcot-Marie-Tooth disease  Primary hypertension -     Ambulatory referral to Nephrology -     Telmisartan ; Take 1 tablet (20 mg total) by mouth daily.  Dispense: 30 tablet; Refill: 0  Chronic pain syndrome  Stage 3a chronic kidney disease (HCC) -     Ambulatory referral to Nephrology  Chronic pancreatitis, unspecified pancreatitis type (HCC)  Splenic artery aneurysm (HCC)  Atrophy of left kidney -     Ambulatory referral to Nephrology      Assessment and Plan Assessment & Plan Chronic Pain Chronic pain secondary to Charcot-Marie-Tooth disease, managed with tramadol and gabapentin . Tramadol provides partial relief, especially nocturnally, while gabapentin  alleviates nerve-related symptoms such as involuntary movements and sensations in the legs. He is open to alternative medications if they are free of adverse effects. - Continue tramadol 50 mg TID - Continue gabapentin  600 mg TID - Will take over prescribing these once his current prescription runs out PDMP reviewed today, no red flags, filling appropriately.    Hypertension Hypertension with white coat syndrome. Home blood pressure readings range from 140s to 160s/80s. Previous antihypertensives exacerbated pancreatitis and caused gastrointestinal symptoms. Management is complicated by medication intolerance and renal considerations. He is open to trying new medications if they do not cause adverse effects. - Refer to [facility] for nephrology consultation - Discuss potential antihypertensive options with supervising physician - Avoid NSAIDs such as aspirin, Aleve, and ibuprofen  Left Renal Atrophy Severe left renal atrophy with compensatory function by the right kidney. Decreased GFR and proteinuria suggest stress on the remaining functional kidney. Imaging shows a shrunken, non-functional left kidney.  He is concerned about preserving kidney function and is open to nephrology consultation. - Refer to [facility] for nephrology consultation - Monitor kidney function through regular lab tests  Chronic Pancreatitis Chronic pancreatitis managed by avoiding known triggers and maintaining dietary restrictions. - Continue to avoid known dietary and medication triggers - Follows with GI Dr. Dianna   Splenic Artery Aneurysm Splenic artery aneurysm under vascular surgery surveillance. No growth observed; repair indicated if it reaches 3 cm. Annual follow-up scheduled. - Continue annual follow-up with vascular surgery  Hyperlipidemia Elevated LDL cholesterol at 130 mg/dL. Dietary modifications discussed, including increased intake of flaxseed, fiber, and fish. He consumes a daily Coca-Cola but has low intake of sweets and carbohydrates. He is open to dietary changes to improve cholesterol levels. - Encourage dietary modifications to lower LDL cholesterol - Monitor lipid levels regularly  General Health Maintenance Low red and white blood cell counts, well-managed since age 46. Regular vaccinations maintained. PSA levels normal, no indication of diabetes or thyroid dysfunction. - Continue regular vaccinations - Monitor blood cell counts periodically  Follow-up Follow-up planned to reassess medication needs and monitor chronic conditions. He is open to communication via MyChart for any interim concerns or medication adjustments. - Schedule follow-up appointment in 8 weeks - Communicate via MyChart for any interim concerns or medication adjustments       Return in about 8 weeks (around 07/14/2024) for recheck/follow-up, blood pressure check.    Subjective:    Chief Complaint  Patient presents with   Medical Management of Chronic Issues    BLOOD PRESSURE RECHECK     HPI Discussed the use of AI scribe software for clinical note transcription with the patient, who gave  verbal consent to  proceed.  History of Present Illness Manuel Castro is a 73 year old male with severe left renal atrophy and high blood pressure who presents for management of his chronic conditions.  He has a history of severe left renal atrophy, with one kidney described as 'shrunken and black.' This condition has likely been present for his entire life. He has not experienced any issues with urination.  He has high blood pressure, which is exacerbated by 'white coat syndrome,' causing elevated readings in medical settings. He has tried several blood pressure medications, including lisinopril , valsartan, and amlodipine , but discontinued them due to adverse effects such as pancreatitis and nausea. He has not been on any consistent blood pressure medication due to these side effects.  He has chronic pancreatitis and manages it by avoiding triggers. Certain blood pressure medications have exacerbated his pancreatitis symptoms, leading to vomiting.  He experiences chronic pain due to Charcot-Marie-Tooth disease and takes tramadol 50 mg three times daily and gabapentin  600 mg three times daily. The tramadol helps him feel better at night, and the gabapentin  helps manage nerve-related symptoms such as involuntary movements and sensations in his legs.  He has a splenic artery aneurysm, which was last evaluated in November 2024, showing no growth. He is scheduled for follow-up in the fall of 2025.  He has a history of low red and white blood cell counts since his twenties, which he attributes to his Charcot-Marie-Tooth disease. He also reports a non-functioning immune system and takes vaccines as a precaution.  He has a history of an enlarged prostate and takes terazosin  to manage urinary symptoms, which has been effective.  He follows a diet that includes broccoli to manage his iron levels, as iron and calcium supplements have previously made him sick. No chronic nausea.     Past Medical History:   Diagnosis Date   Allergy 1957   bee stings   Arthritis 1983   Blood transfusion without reported diagnosis 2022   Cataract 2024   Chronic kidney disease    Chronic pain    b/l knees   CMT (Charcot-Marie-Tooth disease)    Depression    GERD (gastroesophageal reflux disease)    Glaucoma 1970   controlled by eye drops   HTN (hypertension)    Ulcer 1999   bleeding ulcers - cauterized    Past Surgical History:  Procedure Laterality Date   ADENOIDECTOMY     ESOPHAGOGASTRODUODENOSCOPY N/A 02/23/2021   Procedure: ESOPHAGOGASTRODUODENOSCOPY (EGD);  Surgeon: Dianna Specking, MD;  Location: [facility] ENDOSCOPY;  Service: Endoscopy;  Laterality: N/A;   FOOT SURGERY Bilateral    FRACTURE SURGERY  8016,8014   feet   HERNIA REPAIR     NECK SURGERY     TONSILLECTOMY      Family History  Problem Relation Age of Onset   Cancer Mother    Heart attack Mother    Heart disease Mother    Diabetes Father    Heart attack Father    Heart disease Father     Social History   Tobacco Use   Smoking status: Every Day    Current packs/day: 0.25    Average packs/day: 0.3 packs/day for 10.0 years (2.5 ttl pk-yrs)    Types: Cigarettes   Smokeless tobacco: Never  Vaping Use   Vaping status: Never Used  Substance Use Topics   Alcohol use: Not Currently   Drug use: Not Currently    Types: Marijuana     Allergies  Allergen Reactions   Bee Venom  Shortness Of Breath and Swelling   Amlodipine  Besylate Nausea And Vomiting    amlodipine  besylate - severe vomiting per patient    Aspirin Other (See Comments)    Other Reaction(s): bleeds easily  aspirin   Lisinopril  Nausea And Vomiting    Severe vomiting - and flared up his pancreatitis     Review of Systems NEGATIVE UNLESS OTHERWISE INDICATED IN HPI      Objective:     BP (!) 192/90 (BP Location: Left Arm, Patient Position: Sitting, Cuff Size: Normal)   Pulse 77   Temp 98.1 F (36.7 C) (Temporal)   Ht 5' 7 (1.702 m)   Wt 134 lb  9.6 oz (61.1 kg)   SpO2 97%   BMI 21.08 kg/m   Wt Readings from Last 3 Encounters:  05/19/24 134 lb 9.6 oz (61.1 kg)  05/04/24 134 lb 12.8 oz (61.1 kg)  09/25/23 138 lb 1.6 oz (62.6 kg)    BP Readings from Last 3 Encounters:  05/19/24 (!) 192/90  05/04/24 (!) 212/102  09/25/23 (!) 192/101     Physical Exam Vitals and nursing note reviewed.  Constitutional:      General: He is not in acute distress.    Appearance: Normal appearance. He is not toxic-appearing.  HENT:     Head: Normocephalic and atraumatic.     Right Ear: External ear normal.     Left Ear: External ear normal.  Eyes:     Extraocular Movements: Extraocular movements intact.     Conjunctiva/sclera: Conjunctivae normal.     Pupils: Pupils are equal, round, and reactive to light.  Cardiovascular:     Rate and Rhythm: Normal rate and regular rhythm.     Pulses: Normal pulses.     Heart sounds: Normal heart sounds.  Pulmonary:     Effort: Pulmonary effort is normal.     Breath sounds: Wheezing (faint diffuse expiratory wheezing) present.  Abdominal:     General: Bowel sounds are normal.  Musculoskeletal:        General: Deformity present. Normal range of motion.     Cervical back: Normal range of motion and neck supple.  Skin:    General: Skin is warm and dry.  Neurological:     Mental Status: He is alert and oriented to person, place, and time.     Cranial Nerves: No cranial nerve deficit.     Motor: Weakness (bilateral lower extremities; wearing braces on each leg) present.     Gait: Gait abnormal.     Comments: Normal grip strength bilateral upper extremities  Psychiatric:        Mood and Affect: Mood normal.             Loys Hoselton M Darryn Kydd, PA-C

## 2024-06-02 ENCOUNTER — Ambulatory Visit (INDEPENDENT_AMBULATORY_CARE_PROVIDER_SITE_OTHER)

## 2024-06-02 VITALS — Ht 67.0 in | Wt 134.0 lb

## 2024-06-02 DIAGNOSIS — Z0000 Encounter for general adult medical examination without abnormal findings: Secondary | ICD-10-CM

## 2024-06-02 NOTE — Progress Notes (Addendum)
 Subjective:   Manuel Castro is a 73 y.o. who presents for a Medicare Wellness preventive visit.  As a reminder, Annual Wellness Visits don't include a physical exam, and some assessments may be limited, especially if this visit is performed virtually. We may recommend an in-person follow-up visit with your provider if needed.  Visit Complete: Virtual I connected with  Manuel Castro on 06/02/24 by a audio enabled telemedicine application and verified that I am speaking with the correct person using two identifiers.  Patient Location: Home  Provider Location: Office/Clinic  I discussed the limitations of evaluation and management by telemedicine. The patient expressed understanding and agreed to proceed.  Vital Signs: Because this visit was a virtual/telehealth visit, some criteria may be missing or patient reported. Any vitals not documented were not able to be obtained and vitals that have been documented are patient reported.  VideoDeclined- This patient declined Librarian, academic. Therefore the visit was completed with audio only.  Persons Participating in Visit: Patient.  AWV Questionnaire: Yes: Patient Medicare AWV questionnaire was completed by the patient on 06/01/24; I have confirmed that all information answered by patient is correct and no changes since this date.  Cardiac Risk Factors include: advanced age (>72men, >51 women);dyslipidemia;male gender;hypertension     Objective:    Today's Vitals   06/01/24 1233 06/02/24 0852  Weight:  134 lb (60.8 kg)  Height:  5' 7 (1.702 m)  PainSc: 6     Body mass index is 20.99 kg/m.     06/02/2024    8:57 AM 02/20/2021    9:06 PM 02/20/2021   12:56 PM 02/07/2021   10:15 AM  Advanced Directives  Does Patient Have a Medical Advance Directive? Yes No;Yes No Yes  Type of Estate agent of [place];Living will   Healthcare Power of [place];Living will  Copy of Healthcare Power of  Attorney in Chart? No - copy requested     Would patient like information on creating a medical advance directive?   No - Patient declined     Current Medications (verified) Outpatient Encounter Medications as of 06/02/2024  Medication Sig   cyanocobalamin 50 MCG tablet Take 500 mcg by mouth daily.   docusate sodium (COLACE) 100 MG capsule Take 100 mg by mouth. Pt takes 2-3 times weekly as needed   dorzolamide -timolol  (COSOPT ) 22.3-6.8 MG/ML ophthalmic solution Place 1 drop into both eyes 2 (two) times daily.   gabapentin  (NEURONTIN ) 600 MG tablet Take 600 mg by mouth 3 (three) times daily.   lipase/protease/amylase 24000-76000 units CPEP Take 1 capsule (24,000 Units total) by mouth 3 (three) times daily with meals.   Multiple Vitamin (MULTIVITAMIN) capsule as directed Orally   pantoprazole  (PROTONIX ) 40 MG tablet Take 1 tablet (40 mg total) by mouth daily.   telmisartan  (MICARDIS ) 20 MG tablet Take 1 tablet (20 mg total) by mouth daily.   terazosin  (HYTRIN ) 5 MG capsule Take 5 mg by mouth at bedtime.   traMADol (ULTRAM) 50 MG tablet Take 50 mg by mouth 3 (three) times daily.   traZODone  (DESYREL ) 100 MG tablet Take 100 mg by mouth at bedtime.   No facility-administered encounter medications on file as of 06/02/2024.    Allergies (verified) Bee venom, Amlodipine  besylate, Aspirin, and Lisinopril    History: Past Medical History:  Diagnosis Date   Allergy 1957   bee stings   Arthritis 1983   Blood transfusion without reported diagnosis 2022   Cataract 2024   Chronic kidney disease  Chronic pain    b/l knees   CMT (Charcot-Marie-Tooth disease)    Depression    GERD (gastroesophageal reflux disease)    Glaucoma 1970   controlled by eye drops   HTN (hypertension)    Ulcer 1999   bleeding ulcers - cauterized   Past Surgical History:  Procedure Laterality Date   ADENOIDECTOMY     ESOPHAGOGASTRODUODENOSCOPY N/A 02/23/2021   Procedure: ESOPHAGOGASTRODUODENOSCOPY (EGD);   Surgeon: Dianna Specking, MD;  Location: [facility] ENDOSCOPY;  Service: Endoscopy;  Laterality: N/A;   FOOT SURGERY Bilateral    FRACTURE SURGERY  8016,8014   feet   HERNIA REPAIR     NECK SURGERY     TONSILLECTOMY     Family History  Problem Relation Age of Onset   Cancer Mother    Heart attack Mother    Heart disease Mother    Diabetes Father    Heart attack Father    Heart disease Father    Social History   Socioeconomic History   Marital status: Divorced    Spouse name: Not on file   Number of children: Not on file   Years of education: Not on file   Highest education level: Bachelor's degree (e.g., BA, AB, BS)  Occupational History   Not on file  Tobacco Use   Smoking status: Every Day    Current packs/day: 0.25    Average packs/day: 0.3 packs/day for 10.0 years (2.5 ttl pk-yrs)    Types: Cigarettes   Smokeless tobacco: Never  Vaping Use   Vaping status: Never Used  Substance and Sexual Activity   Alcohol use: Not Currently   Drug use: Not Currently    Types: Marijuana   Sexual activity: Not Currently    Birth control/protection: None  Other Topics Concern   Not on file  Social History Narrative   Not on file   Social Drivers of Health   Financial Resource Strain: Low Risk  (06/02/2024)   Overall Financial Resource Strain (CARDIA)    Difficulty of Paying Living Expenses: Not hard at all  Food Insecurity: No Food Insecurity (06/02/2024)   Hunger Vital Sign    Worried About Running Out of Food in the Last Year: Never true    Ran Out of Food in the Last Year: Never true  Transportation Needs: No Transportation Needs (06/02/2024)   PRAPARE - Administrator, Civil Service (Medical): No    Lack of Transportation (Non-Medical): No  Recent Concern: Transportation Needs - Unmet Transportation Needs (05/02/2024)   PRAPARE - Transportation    Lack of Transportation (Medical): Yes    Lack of Transportation (Non-Medical): Yes  Physical Activity: Insufficiently  Active (06/02/2024)   Exercise Vital Sign    Days of Exercise per Week: 2 days    Minutes of Exercise per Session: 60 min  Stress: No Stress Concern Present (06/02/2024)   [employer] of Occupational Health - Occupational Stress Questionnaire    Feeling of Stress: Not at all  Social Connections: Moderately Isolated (06/02/2024)   Social Connection and Isolation Panel    Frequency of Communication with Friends and Family: More than three times a week    Frequency of Social Gatherings with Friends and Family: More than three times a week    Attends Religious Services: Never    Database administrator or Organizations: Yes    Attends Banker Meetings: 1 to 4 times per year    Marital Status: Divorced    Tobacco Counseling  Ready to quit: Not Answered Counseling given: Not Answered    Clinical Intake:  Pre-visit preparation completed: Yes  Pain : 0-10 Pain Score: 6  Pain Type: Chronic pain Pain Location: Back Pain Descriptors / Indicators: Stabbing Pain Onset: More than a month ago Pain Frequency: Intermittent     BMI - recorded: 20.99 Nutritional Status: BMI of 19-24  Normal Nutritional Risks: None Diabetes: No  Lab Results  Component Value Date   HGBA1C 5.4 05/04/2024     How often do you need to have someone help you when you read instructions, pamphlets, or other written materials from your doctor or pharmacy?: 1 - Never  Interpreter Needed?: No  Information entered by :: Manuel Haws LPN   Activities of Daily Living     06/01/2024   12:33 PM  In your present state of health, do you have any difficulty performing the following activities:  Hearing? 0  Vision? 0  Difficulty concentrating or making decisions? 0  Walking or climbing stairs? 1  Dressing or bathing? 0  Doing errands, shopping? 0  Preparing Food and eating ? N  Using the Toilet? N  In the past six months, have you accidently leaked urine? N  Do you have problems with loss  of bowel control? N  Managing your Medications? N  Managing your Finances? N  Housekeeping or managing your Housekeeping? N    Patient Care Team: Allwardt, Alyssa M, PA-C as PCP - General (Physician Assistant)  I have updated your Care Teams any recent Medical Services you may have received from other providers in the past year.     Assessment:   This is a routine wellness examination for Manuel Castro.  Hearing/Vision screen Hearing Screening - Comments:: Pt denies any hearing issues  Vision Screening - Comments:: Wears rx glasses - up to date with routine eye exams with [facility] and Dr sun    Goals Addressed             This Visit's Progress    Patient Stated       Get back into exercise routine        Depression Screen     06/02/2024    8:58 AM 05/19/2024   11:03 AM 05/04/2024   11:21 AM  PHQ 2/9 Scores  PHQ - 2 Score 0 0 0  PHQ- 9 Score 0 7     Fall Risk     06/01/2024   12:33 PM 05/19/2024   11:03 AM 05/04/2024   11:20 AM  Fall Risk   Falls in the past year? 1 1 1   Number falls in past yr: 1 1   Injury with Fall? 1 1 1   Comment back and knee injury    Risk for fall due to : History of fall(s);Impaired balance/gait;Impaired mobility History of fall(s) History of fall(s);Impaired balance/gait;Impaired mobility;Orthopedic patient;Other (Comment)  Risk for fall due to: Comment   Patient states falls frequently  Follow up Falls prevention discussed Falls evaluation completed Falls evaluation completed;Falls prevention discussed    MEDICARE RISK AT HOME:  Medicare Risk at Home Any stairs in or around the home?: (Patient-Rptd) No Home free of loose throw rugs in walkways, pet beds, electrical cords, etc?: (Patient-Rptd) Yes Adequate lighting in your home to reduce risk of falls?: (Patient-Rptd) Yes Life alert?: (Patient-Rptd) No Use of a cane, walker or w/c?: (Patient-Rptd) Yes Grab bars in the bathroom?: (Patient-Rptd) Yes Shower chair or bench in shower?:  (Patient-Rptd) Yes Elevated toilet seat or  a handicapped toilet?: (Patient-Rptd) No  TIMED UP AND GO:  Was the test performed?  No  Cognitive Function: 6CIT completed        06/02/2024    9:00 AM  6CIT Screen  What Year? 0 points  What month? 0 points  What time? 0 points  Count back from 20 0 points  Months in reverse 0 points  Repeat phrase 0 points  Total Score 0 points    Immunizations Immunization History  Administered Date(s) Administered   Fluad Quad(high Dose 65+) 07/21/2020, 08/16/2022   Fluzone Influenza virus vaccine,trivalent (IIV3), split virus 09/17/2011, 08/14/2012, 07/30/2016   Influenza, High Dose Seasonal PF 08/19/2017, 08/27/2018, 07/14/2023   Influenza,inj,quad, With Preservative 09/01/2014   Influenza-Unspecified 09/23/2015   Moderna Covid-19 Fall Seasonal Vaccine 27yrs & older 08/16/2022, 07/14/2023   PFIZER(Purple Top)SARS-COV-2 Vaccination 12/05/2019, 12/26/2019, 07/27/2020, 09/11/2020   PNEUMOCOCCAL CONJUGATE-20 12/20/2022   Pfizer Covid-19 Vaccine Bivalent Booster 91yrs & up 07/31/2021   Pneumococcal Polysaccharide-23 08/22/2018   Tdap 09/17/2011, 07/26/2019   Zoster, Live 11/12/2013    Screening Tests Health Maintenance  Topic Date Due   COVID-19 Vaccine (8 - Pfizer risk 2024-25 season) 01/11/2024   Zoster Vaccines- Shingrix (1 of 2) 08/04/2024 (Originally 07/17/1970)   Hepatitis C Screening  05/04/2025 (Originally 07/17/1969)   INFLUENZA VACCINE  06/12/2024   Medicare Annual Wellness (AWV)  06/02/2025   Colonoscopy  10/25/2027   DTaP/Tdap/Td (3 - Td or Tdap) 07/25/2029   Pneumococcal Vaccine: 50+ Years  Completed   Hepatitis B Vaccines  Aged Out   HPV VACCINES  Aged Out   Meningococcal B Vaccine  Aged Out    Health Maintenance  Health Maintenance Due  Topic Date Due   COVID-19 Vaccine (8 - Pfizer risk 2024-25 season) 01/11/2024   Health Maintenance Items Addressed: See Nurse Notes at the end of this note  Additional  Screening:  Vision Screening: Recommended annual ophthalmology exams for early detection of glaucoma and other disorders of the eye. Would you like a referral to an eye doctor? No    Dental Screening: Recommended annual dental exams for proper oral hygiene  Community Resource Referral / Chronic Care Management: CRR required this visit?  No   CCM required this visit?  No   Plan:    I have personally reviewed and noted the following in the patient's chart:   Medical and social history Use of alcohol, tobacco or illicit drugs  Current medications and supplements including opioid prescriptions. Patient is not currently taking opioid prescriptions. Functional ability and status Nutritional status Physical activity Advanced directives List of other physicians Hospitalizations, surgeries, and ER visits in previous 12 months Vitals Screenings to include cognitive, depression, and falls Referrals and appointments  In addition, I have reviewed and discussed with patient certain preventive protocols, quality metrics, and best practice recommendations. A written personalized care plan for preventive services as well as general preventive health recommendations were provided to patient.   Manuel VEAR Haws, LPN   2/77/[id number]   After Visit Summary: (MyChart) Due to this being a telephonic visit, the after visit summary with patients personalized plan was offered to patient via MyChart   Notes: Nothing significant to report at this time.

## 2024-06-02 NOTE — Patient Instructions (Signed)
 Mr. Manuel Castro , Thank you for taking time out of your busy schedule to complete your Annual Wellness Visit with me. I enjoyed our conversation and look forward to speaking with you again next year. I, as well as your care team,  appreciate your ongoing commitment to your health goals. Please review the following plan we discussed and let me know if I can assist you in the future. Your Game plan/ To Do List    Referrals: If you haven't heard from the office you've been referred to, please reach out to them at the phone provided.   Follow up Visits: Next Medicare AWV with our clinical staff: 06/08/25   Have you seen your provider in the last 6 months (3 months if uncontrolled diabetes)? Yes Next Office Visit with your provider: 07/14/24  Clinician Recommendations:  Aim for 30 minutes of exercise or brisk walking, 6-8 glasses of water, and 5 servings of fruits and vegetables each day.       This is a list of the screening recommended for you and due dates:  Health Maintenance  Topic Date Due   COVID-19 Vaccine (8 - Pfizer risk 2024-25 season) 01/11/2024   Medicare Annual Wellness Visit  06/03/2024*   Zoster (Shingles) Vaccine (1 of 2) 08/04/2024*   Hepatitis C Screening  05/04/2025*   Flu Shot  06/12/2024   Colon Cancer Screening  10/25/2027   DTaP/Tdap/Td vaccine (3 - Td or Tdap) 07/25/2029   Pneumococcal Vaccine for age over 61  Completed   Hepatitis B Vaccine  Aged Out   HPV Vaccine  Aged Out   Meningitis B Vaccine  Aged Out  *Topic was postponed. The date shown is not the original due date.    Advanced directives: (Copy Requested) Please bring a copy of your health care power of attorney and living will to the office to be added to your chart at your convenience. You can mail to [facility] [address] [place], [place] [address] or email to [email or web address] Advance Care Planning is important because it:  [x]  Makes sure you receive the medical care that is  consistent with your values, goals, and preferences  [x]  It provides guidance to your family and loved ones and reduces their decisional burden about whether or not they are making the right decisions based on your wishes.  Follow the link provided in your after visit summary or read over the paperwork we have mailed to you to help you started getting your Advance Directives in place. If you need assistance in completing these, please reach out to us  so that we can help you!  See attachments for Preventive Care and Fall Prevention Tips.

## 2024-06-16 ENCOUNTER — Other Ambulatory Visit: Payer: Self-pay | Admitting: Physician Assistant

## 2024-06-16 DIAGNOSIS — I10 Essential (primary) hypertension: Secondary | ICD-10-CM

## 2024-06-16 LAB — LAB REPORT - SCANNED: EGFR: 52

## 2024-07-13 ENCOUNTER — Other Ambulatory Visit: Payer: Self-pay | Admitting: Physician Assistant

## 2024-07-13 DIAGNOSIS — I10 Essential (primary) hypertension: Secondary | ICD-10-CM

## 2024-07-14 ENCOUNTER — Ambulatory Visit: Admitting: Physician Assistant

## 2024-07-22 ENCOUNTER — Encounter: Payer: Self-pay | Admitting: Physician Assistant

## 2024-07-22 ENCOUNTER — Ambulatory Visit (INDEPENDENT_AMBULATORY_CARE_PROVIDER_SITE_OTHER): Admitting: Physician Assistant

## 2024-07-22 VITALS — BP 188/90 | HR 76 | Temp 98.6°F | Ht 67.0 in | Wt 134.6 lb

## 2024-07-22 DIAGNOSIS — Z23 Encounter for immunization: Secondary | ICD-10-CM

## 2024-07-22 DIAGNOSIS — N1831 Chronic kidney disease, stage 3a: Secondary | ICD-10-CM

## 2024-07-22 DIAGNOSIS — G894 Chronic pain syndrome: Secondary | ICD-10-CM | POA: Diagnosis not present

## 2024-07-22 DIAGNOSIS — Z1211 Encounter for screening for malignant neoplasm of colon: Secondary | ICD-10-CM | POA: Insufficient documentation

## 2024-07-22 DIAGNOSIS — I10 Essential (primary) hypertension: Secondary | ICD-10-CM

## 2024-07-22 DIAGNOSIS — G600 Hereditary motor and sensory neuropathy: Secondary | ICD-10-CM | POA: Diagnosis not present

## 2024-07-22 DIAGNOSIS — F5104 Psychophysiologic insomnia: Secondary | ICD-10-CM | POA: Diagnosis not present

## 2024-07-22 MED ORDER — GABAPENTIN 600 MG PO TABS: 600.0000 mg | ORAL_TABLET | Freq: Three times a day (TID) | ORAL | 1 refills | Status: DC

## 2024-07-22 MED ORDER — TRAMADOL HCL 50 MG PO TABS: 50.0000 mg | ORAL_TABLET | Freq: Three times a day (TID) | ORAL | 0 refills | Status: DC

## 2024-07-22 MED ORDER — TELMISARTAN 40 MG PO TABS: 40.0000 mg | ORAL_TABLET | Freq: Every day | ORAL | 2 refills | Status: DC

## 2024-07-22 MED ORDER — TERAZOSIN HCL 5 MG PO CAPS
5.0000 mg | ORAL_CAPSULE | Freq: Every day | ORAL | 2 refills | Status: DC
  Filled 2024-08-11: qty 30, 30d supply, fill #0

## 2024-07-22 MED ORDER — TRAZODONE HCL 100 MG PO TABS: 100.0000 mg | ORAL_TABLET | Freq: Every day | ORAL | 2 refills | Status: DC

## 2024-07-22 NOTE — Progress Notes (Unsigned)
 Patient ID: Manuel Castro, male    DOB: Jan 29, 1951, 73 y.o.   MRN: [id number]   Assessment & Plan:  Primary hypertension  Chronic insomnia -     Terazosin  HCl; Take 1 capsule (5 mg total) by mouth at bedtime.  Dispense: 30 capsule; Refill: 2 -     traZODone  HCl; Take 1 tablet (100 mg total) by mouth at bedtime.  Dispense: 30 tablet; Refill: 2  Immunization due -     Flu vaccine HIGH DOSE PF(Fluzone Trivalent)  Charcot-Marie-Tooth disease -     Ambulatory referral to Physical Medicine Rehab  Chronic pain syndrome -     Ambulatory referral to Physical Medicine Rehab  Stage 3a chronic kidney disease (HCC)  Other orders -     Gabapentin ; Take 1 tablet (600 mg total) by mouth 3 (three) times daily.  Dispense: 270 tablet; Refill: 1 -     traMADol  HCl; Take 1 tablet (50 mg total) by mouth in the morning, at noon, and at bedtime.  Dispense: 90 tablet; Refill: 0 -     traMADol  HCl; Take 1 tablet (50 mg total) by mouth in the morning, at noon, and at bedtime.  Dispense: 90 tablet; Refill: 0 -     traMADol  HCl; Take 1 tablet (50 mg total) by mouth in the morning, at noon, and at bedtime.  Dispense: 90 tablet; Refill: 0 -     Telmisartan ; Take 1 tablet (40 mg total) by mouth daily.  Dispense: 30 tablet; Refill: 2      Assessment and Plan Assessment & Plan Chronic kidney disease, stage 3a Chronic kidney disease stage 3a with a recent poor experience at the nephrology clinic. He experienced nausea and diarrhea with Veltassa, leading to discontinuation. He is not willing to return to the nephrology clinic and prefers lifestyle management and blood pressure control. - Monitor kidney function through regular lab tests - Encourage lifestyle modifications to manage kidney health  Essential hypertension Hypertension currently managed with telmisartan  20 mg once daily. Blood pressure is well-controlled in the morning but increases throughout the day. He inquired about increasing the dose to  improve control. After reviewing his medication, it was decided to increase the dose to 40 mg once daily, as it is a once-daily dosing medication and no dosage adjustment is needed for kidney dysfunction. - Increase telmisartan  to 40 mg once daily - Monitor blood pressure response to dose adjustment  Chronic pain syndrome Chronic pain syndrome with opioid addiction. Currently managed with gabapentin  and tramadol , which provide limited relief. He is in chronic pain and tired, but is managing without stronger opioids. He is open to exploring alternative management strategies, including a referral to Dr. Megan Laverne for physical medicine and rehabilitation consultation. - Continue gabapentin  600 mg three times daily - Continue tramadol  50 mg three times daily - Refer to Dr. 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 He is experiencing significant discomfort and is seeking alternative management strategies. - Refer to Dr. Megan Lovorn for specialized management and potential lifestyle interventions  Psychophysiologic insomnia Chronic insomnia since childhood, managed with terazosin  and trazodone  at night, providing limited sleep duration. - Continue terazosin  and trazodone  at bedtime for sleep management  General Health Maintenance He is at high risk for COVID-19 and is in favor of receiving the vaccine. He is also due for a flu shot. - Administer flu shot today - Advise  to obtain COVID-19 vaccine when available      Return in about 3 months (around 10/26/2024) for recheck/follow-up.    Subjective:    Chief Complaint  Patient presents with   Medical Management of Chronic Issues    Pt in office for 8 wk follow up with PCP; pt asking for Covid vaccine Rx or info to complete at [facility];    HPI Discussed the use  of AI scribe software for clinical note transcription with the patient, who gave verbal consent to proceed.  History of Present Illness Manuel Castro is a 73 year old male with chronic pain and hypertension who presents for medication management and follow-up.  He experiences chronic pain described as 'everything hurts' and persistent fatigue, feeling 'tired all the time now.' He is currently taking tramadol  50 mg three times daily, which provides minimal relief, and gabapentin  600 mg three times daily, which he finds effective in combination with tramadol . Due to a history of opioid addiction, he is cautious about using stronger pain medications.  He has chronic kidney disease stage 3A and had an unsatisfactory experience with a nephrology clinic. He was prescribed Veltassa, which caused nausea and diarrhea, leading to discontinuation after two days. He did not complete the follow-up blood test and prefers to manage his condition through lifestyle changes and blood pressure management.  He takes telmisartan  20 mg in the morning for hypertension. His blood pressure is better controlled in the morning but increases throughout the day.  He has a history of pancreatitis and has been advised to avoid certain foods. He has reduced his Advil intake from eight pills to an average of two pills per week over the past three to four weeks. He has also quit smoking recently, although he is uncertain about maintaining this change.  He has chronic insomnia, a condition present since childhood, and takes terazosin  and trazodone  at night, which help him sleep for about four hours. His son and grandson also suffer from chronic insomnia, suggesting a genetic component.  He is at high risk for COVID-19 and plans to receive the vaccine by the end of the month. He has not yet received his flu shot.     Past Medical History:  Diagnosis Date   Allergy 1957   bee stings   Arthritis 1983   Blood transfusion without  reported diagnosis 2022   Cataract 2024   Chronic kidney disease    Chronic pain    b/l knees   CMT (Charcot-Marie-Tooth disease)    Depression    GERD (gastroesophageal reflux disease)    Glaucoma 1970   controlled by eye drops   HTN (hypertension)    Ulcer 1999   bleeding ulcers - cauterized    Past Surgical History:  Procedure Laterality Date   ADENOIDECTOMY     ESOPHAGOGASTRODUODENOSCOPY N/A 02/23/2021   Procedure: ESOPHAGOGASTRODUODENOSCOPY (EGD);  Surgeon: Dianna Specking, MD;  Location: [facility] ENDOSCOPY;  Service: Endoscopy;  Laterality: N/A;   FOOT SURGERY Bilateral    FRACTURE SURGERY  8016,8014   feet   HERNIA REPAIR     NECK SURGERY     TONSILLECTOMY      Family History  Problem Relation Age of Onset   Cancer Mother    Heart attack Mother    Heart disease Mother    Diabetes Father    Heart attack Father    Heart disease Father     Social History   Tobacco Use   Smoking status: Every Day  Current packs/day: 0.25    Average packs/day: 0.3 packs/day for 10.0 years (2.5 ttl pk-yrs)    Types: Cigarettes   Smokeless tobacco: Never  Vaping Use   Vaping status: Never Used  Substance Use Topics   Alcohol use: Not Currently   Drug use: Not Currently    Types: Marijuana     Allergies  Allergen Reactions   Bee Venom Shortness Of Breath and Swelling   Amlodipine  Besylate Nausea And Vomiting    amlodipine  besylate - severe vomiting per patient    Aspirin Other (See Comments)    Other Reaction(s): bleeds easily  aspirin   Lisinopril  Nausea And Vomiting    Severe vomiting - and flared up his pancreatitis     Review of Systems NEGATIVE UNLESS OTHERWISE INDICATED IN HPI      Objective:     BP (!) 188/90 (BP Location: Left Arm, Patient Position: Sitting, Cuff Size: Normal)   Pulse 76   Temp 98.6 F (37 C) (Temporal)   Ht 5' 7 (1.702 m)   Wt 134 lb 9.6 oz (61.1 kg)   SpO2 98%   BMI 21.08 kg/m   Wt Readings from Last 3 Encounters:  07/22/24  134 lb 9.6 oz (61.1 kg)  06/02/24 134 lb (60.8 kg)  05/19/24 134 lb 9.6 oz (61.1 kg)    BP Readings from Last 3 Encounters:  07/22/24 (!) 188/90  05/19/24 (!) 192/90  05/04/24 (!) 212/102     Physical Exam Vitals and nursing note reviewed.  Constitutional:      General: He is not in acute distress.    Appearance: Normal appearance. He is not toxic-appearing.  HENT:     Head: Normocephalic and atraumatic.     Right Ear: External ear normal.     Left Ear: External ear normal.  Eyes:     Extraocular Movements: Extraocular movements intact.     Conjunctiva/sclera: Conjunctivae normal.     Pupils: Pupils are equal, round, and reactive to light.  Cardiovascular:     Rate and Rhythm: Normal rate and regular rhythm.     Pulses: Normal pulses.     Heart sounds: Normal heart sounds.  Pulmonary:     Effort: Pulmonary effort is normal.     Breath sounds: Wheezing (faint diffuse expiratory wheezing) present.  Abdominal:     General: Bowel sounds are normal.  Musculoskeletal:        General: Deformity present. Normal range of motion.     Cervical back: Normal range of motion and neck supple.  Skin:    General: Skin is warm and dry.  Neurological:     Mental Status: He is alert and oriented to person, place, and time.     Cranial Nerves: No cranial nerve deficit.     Motor: Weakness (bilateral lower extremities; wearing braces on each leg) present.     Gait: Gait abnormal.     Comments: Normal grip strength bilateral upper extremities  Psychiatric:        Mood and Affect: Mood normal.             Marvel Mcphillips M Noralee Dutko, PA-C

## 2024-07-24 ENCOUNTER — Other Ambulatory Visit (HOSPITAL_BASED_OUTPATIENT_CLINIC_OR_DEPARTMENT_OTHER): Payer: Self-pay

## 2024-07-24 MED ORDER — GABAPENTIN 600 MG PO TABS
600.0000 mg | ORAL_TABLET | Freq: Three times a day (TID) | ORAL | 1 refills | Status: DC
  Filled 2024-10-15: qty 270, 90d supply, fill #0

## 2024-07-24 MED ORDER — DORZOLAMIDE HCL-TIMOLOL MAL 2-0.5 % OP SOLN
1.0000 [drp] | Freq: Two times a day (BID) | OPHTHALMIC | 12 refills | Status: DC
  Filled 2024-10-09: qty 10, 50d supply, fill #0

## 2024-07-24 MED ORDER — PANTOPRAZOLE SODIUM 40 MG PO TBEC: 40.0000 mg | DELAYED_RELEASE_TABLET | Freq: Every day | ORAL | 4 refills | Status: DC

## 2024-08-05 ENCOUNTER — Other Ambulatory Visit (HOSPITAL_BASED_OUTPATIENT_CLINIC_OR_DEPARTMENT_OTHER): Payer: Self-pay

## 2024-08-05 NOTE — Telephone Encounter (Unsigned)
 Copied from CRM [id number]. Topic: Clinical - Medication Refill >> Aug 05, 2024 11:18 AM Harlene ORN wrote: Medication: traMADol  (ULTRAM ) 50 MG tablet  Has the patient contacted their pharmacy? Yes (Agent: If no, request that the patient contact the pharmacy for the refill. If patient does not wish to contact the pharmacy document the reason why and proceed with request.) (Agent: If yes, when and what did the pharmacy advise?)  [facility] [address] [place] [address] Phone: [phone] Fax: [phone] Hours: Mon-Fri 7:30am-7pm; Sat 8:00am-4:30p  Is this the correct pharmacy for this prescription? Yes If no, delete pharmacy and type the correct one.   Has the prescription been filled recently? No  Is the patient out of the medication? No  Has the patient been seen for an appointment in the last year OR does the patient have an upcoming appointment? Yes  Can we respond through MyChart? Yes  Agent: Please be advised that Rx refills may take up to 3 business days. We ask that you follow-up with your pharmacy.

## 2024-08-07 ENCOUNTER — Telehealth: Payer: Self-pay

## 2024-08-07 ENCOUNTER — Other Ambulatory Visit (HOSPITAL_BASED_OUTPATIENT_CLINIC_OR_DEPARTMENT_OTHER): Payer: Self-pay

## 2024-08-07 NOTE — Telephone Encounter (Signed)
 Copied from CRM [id number]. Topic: Clinical - Prescription Issue >> Aug 07, 2024  3:30 PM Chiquita SQUIBB wrote: Reason for CRM: Patient is calling in stating his medication for traMADol  (ULTRAM ) 50 MG tablet was suppose to go to [facility] and was sent to the wrong pharmacy. Patient is asking for this to be re sent. Patient states he is completely out and has been out since yesterday.

## 2024-08-10 ENCOUNTER — Encounter: Payer: Self-pay | Admitting: Physical Medicine and Rehabilitation

## 2024-08-10 ENCOUNTER — Other Ambulatory Visit: Payer: Self-pay | Admitting: Physician Assistant

## 2024-08-10 NOTE — Telephone Encounter (Signed)
 Please see pt msg and send Sept Oct and Nov Rx for Tramadol  to [facility] per pt request

## 2024-08-10 NOTE — Telephone Encounter (Signed)
 Pt states he contacted our office and spoke with someone 08/05/24 and advised he would no longer be using [facility] and pharmacy needs to be updated to [facility]. No note in chart regarding pharmacy update or call from patient. Pt states no longer wants the prescription for Tramadol  since he hasn't had it since Friday and doesn't want to deal with it anymore. Per patient when getting 30 day supply he goes through a hassle every month and not having enough medication on months with 31 days; this causes more pain and irritation when unable to get Rx filled on time. Apologized to patient for any inconvenience and advised I would pass this along to PCP.

## 2024-08-11 ENCOUNTER — Other Ambulatory Visit (HOSPITAL_BASED_OUTPATIENT_CLINIC_OR_DEPARTMENT_OTHER): Payer: Self-pay

## 2024-08-12 ENCOUNTER — Other Ambulatory Visit: Payer: Self-pay

## 2024-08-12 ENCOUNTER — Other Ambulatory Visit: Payer: Self-pay | Admitting: Physician Assistant

## 2024-08-12 DIAGNOSIS — I10 Essential (primary) hypertension: Secondary | ICD-10-CM

## 2024-08-14 ENCOUNTER — Other Ambulatory Visit: Payer: Self-pay

## 2024-08-14 ENCOUNTER — Encounter: Payer: Self-pay | Admitting: Physical Medicine and Rehabilitation

## 2024-08-14 ENCOUNTER — Other Ambulatory Visit (HOSPITAL_BASED_OUTPATIENT_CLINIC_OR_DEPARTMENT_OTHER): Payer: Self-pay

## 2024-08-14 ENCOUNTER — Encounter: Attending: Physical Medicine and Rehabilitation | Admitting: Physical Medicine and Rehabilitation

## 2024-08-14 VITALS — BP 191/107 | HR 74 | Ht 67.0 in | Wt 134.0 lb

## 2024-08-14 DIAGNOSIS — M792 Neuralgia and neuritis, unspecified: Secondary | ICD-10-CM | POA: Diagnosis not present

## 2024-08-14 DIAGNOSIS — M48062 Spinal stenosis, lumbar region with neurogenic claudication: Secondary | ICD-10-CM | POA: Insufficient documentation

## 2024-08-14 DIAGNOSIS — G894 Chronic pain syndrome: Secondary | ICD-10-CM | POA: Insufficient documentation

## 2024-08-14 DIAGNOSIS — G600 Hereditary motor and sensory neuropathy: Secondary | ICD-10-CM | POA: Diagnosis present

## 2024-08-14 DIAGNOSIS — M47812 Spondylosis without myelopathy or radiculopathy, cervical region: Secondary | ICD-10-CM | POA: Diagnosis not present

## 2024-08-14 MED ORDER — LIDOCAINE 5 % EX OINT
1.0000 | TOPICAL_OINTMENT | Freq: Four times a day (QID) | CUTANEOUS | 5 refills | Status: AC | PRN
  Filled 2024-08-14: qty 50, 13d supply, fill #0

## 2024-08-14 MED ORDER — DULOXETINE HCL 30 MG PO CPEP
30.0000 mg | ORAL_CAPSULE | Freq: Every day | ORAL | 5 refills | Status: DC
  Filled 2024-08-14: qty 60, 60d supply, fill #0
  Filled 2024-10-09: qty 60, 60d supply, fill #1
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
## 2024-08-15 ENCOUNTER — Other Ambulatory Visit (HOSPITAL_BASED_OUTPATIENT_CLINIC_OR_DEPARTMENT_OTHER): Payer: Self-pay

## 2024-08-15 ENCOUNTER — Other Ambulatory Visit: Payer: Self-pay | Admitting: Physician Assistant

## 2024-08-24 ENCOUNTER — Other Ambulatory Visit (HOSPITAL_BASED_OUTPATIENT_CLINIC_OR_DEPARTMENT_OTHER): Payer: Self-pay

## 2024-08-25 ENCOUNTER — Other Ambulatory Visit: Payer: Self-pay

## 2024-08-25 ENCOUNTER — Other Ambulatory Visit (HOSPITAL_BASED_OUTPATIENT_CLINIC_OR_DEPARTMENT_OTHER): Payer: Self-pay

## 2024-08-25 DIAGNOSIS — F5104 Psychophysiologic insomnia: Secondary | ICD-10-CM

## 2024-08-25 MED ORDER — TELMISARTAN 40 MG PO TABS
40.0000 mg | ORAL_TABLET | Freq: Every day | ORAL | 2 refills | Status: DC
  Filled 2024-08-25 – 2024-09-21 (×3): qty 30, 30d supply, fill #0
  Filled 2024-09-21 – 2024-10-15 (×2): qty 30, 30d supply, fill #1

## 2024-08-25 MED ORDER — GABAPENTIN 600 MG PO TABS
600.0000 mg | ORAL_TABLET | Freq: Three times a day (TID) | ORAL | 1 refills | Status: AC
  Filled 2024-08-25: qty 270, 90d supply, fill #0

## 2024-08-25 MED ORDER — TERAZOSIN HCL 5 MG PO CAPS
5.0000 mg | ORAL_CAPSULE | Freq: Every day | ORAL | 2 refills | Status: AC
  Filled 2024-08-25 – 2024-09-21 (×3): qty 30, 30d supply, fill #0
  Filled 2024-12-16: qty 30, 30d supply, fill #1

## 2024-08-25 MED ORDER — TRAZODONE HCL 100 MG PO TABS
100.0000 mg | ORAL_TABLET | Freq: Every day | ORAL | 2 refills | Status: AC
  Filled 2024-08-25 – 2024-09-21 (×3): qty 30, 30d supply, fill #0
  Filled 2024-11-17: qty 30, 30d supply, fill #1
  Filled 2024-12-16: qty 30, 30d supply, fill #2

## 2024-08-25 NOTE — Telephone Encounter (Signed)
 Copied from CRM [id number]. Topic: Clinical - Prescription Issue >> Aug 24, 2024 12:19 PM Corin V wrote: Reason for CRM: Patient called and stated that he has spoken to [facility] pharmacy was made aware he would be using them for his prescriptions and they stated they will be taking over his refills and getting all scripts from the [facility] transferred. [facility] has stated they cannot fill these scripts due to having sent everything to [facility], but [facility] is stating they cannot fill the telmisartan  (MICARDIS ) 40 MG tablet. He has been out of the telmisartan  since Friday. The tramadol  was also having issues and he has stopped taking that due to the pharmacy not being able to fill this. He is requesting that all current scripts be discontinued and all scripts be reordered so a fresh script can be sent in for [facility] to have active scripts they can fill.   All Rx on file filled by PCP sent to [facility]; please send new Rx for Gabapentin  to [facility] per patient request.

## 2024-08-26 ENCOUNTER — Other Ambulatory Visit (HOSPITAL_BASED_OUTPATIENT_CLINIC_OR_DEPARTMENT_OTHER): Payer: Self-pay

## 2024-09-07 ENCOUNTER — Encounter: Payer: Self-pay | Admitting: Physical Medicine and Rehabilitation

## 2024-09-21 ENCOUNTER — Other Ambulatory Visit (HOSPITAL_COMMUNITY): Payer: Self-pay

## 2024-09-21 ENCOUNTER — Other Ambulatory Visit (HOSPITAL_BASED_OUTPATIENT_CLINIC_OR_DEPARTMENT_OTHER): Payer: Self-pay

## 2024-09-22 ENCOUNTER — Other Ambulatory Visit (HOSPITAL_BASED_OUTPATIENT_CLINIC_OR_DEPARTMENT_OTHER): Payer: Self-pay

## 2024-09-22 MED ORDER — COMIRNATY 30 MCG/0.3ML IM SUSY
0.3000 mL | PREFILLED_SYRINGE | Freq: Once | INTRAMUSCULAR | 0 refills | Status: AC
  Filled 2024-09-22: qty 0.3, 1d supply, fill #0

## 2024-09-28 ENCOUNTER — Other Ambulatory Visit (HOSPITAL_BASED_OUTPATIENT_CLINIC_OR_DEPARTMENT_OTHER): Payer: Self-pay

## 2024-10-09 ENCOUNTER — Other Ambulatory Visit (HOSPITAL_BASED_OUTPATIENT_CLINIC_OR_DEPARTMENT_OTHER): Payer: Self-pay

## 2024-10-15 ENCOUNTER — Other Ambulatory Visit (HOSPITAL_BASED_OUTPATIENT_CLINIC_OR_DEPARTMENT_OTHER): Payer: Self-pay

## 2024-10-23 ENCOUNTER — Other Ambulatory Visit (HOSPITAL_BASED_OUTPATIENT_CLINIC_OR_DEPARTMENT_OTHER): Payer: Self-pay

## 2024-10-23 ENCOUNTER — Other Ambulatory Visit: Payer: Self-pay

## 2024-10-23 ENCOUNTER — Encounter: Payer: Self-pay | Admitting: Physician Assistant

## 2024-10-23 ENCOUNTER — Ambulatory Visit: Admitting: Physician Assistant

## 2024-10-23 VITALS — BP 196/98 | HR 64 | Temp 97.9°F | Ht 67.0 in | Wt 143.2 lb

## 2024-10-23 DIAGNOSIS — J449 Chronic obstructive pulmonary disease, unspecified: Secondary | ICD-10-CM

## 2024-10-23 DIAGNOSIS — I1A0 Resistant hypertension: Secondary | ICD-10-CM

## 2024-10-23 DIAGNOSIS — K861 Other chronic pancreatitis: Secondary | ICD-10-CM

## 2024-10-23 DIAGNOSIS — F17210 Nicotine dependence, cigarettes, uncomplicated: Secondary | ICD-10-CM

## 2024-10-23 DIAGNOSIS — N1831 Chronic kidney disease, stage 3a: Secondary | ICD-10-CM

## 2024-10-23 DIAGNOSIS — G894 Chronic pain syndrome: Secondary | ICD-10-CM

## 2024-10-23 DIAGNOSIS — K219 Gastro-esophageal reflux disease without esophagitis: Secondary | ICD-10-CM

## 2024-10-23 DIAGNOSIS — G600 Hereditary motor and sensory neuropathy: Secondary | ICD-10-CM

## 2024-10-23 MED ORDER — PANCRELIPASE (LIP-PROT-AMYL) 24000-76000 UNITS PO CPEP
24000.0000 [IU] | ORAL_CAPSULE | Freq: Three times a day (TID) | ORAL | 1 refills | Status: AC
  Filled 2024-10-23 (×2): qty 200, 67d supply, fill #0

## 2024-10-23 MED ORDER — TRAMADOL HCL 50 MG PO TABS
50.0000 mg | ORAL_TABLET | Freq: Three times a day (TID) | ORAL | 0 refills | Status: AC
  Filled 2024-10-23 – 2024-11-21 (×2): qty 90, 30d supply, fill #0

## 2024-10-23 MED ORDER — PANTOPRAZOLE SODIUM 40 MG PO TBEC
40.0000 mg | DELAYED_RELEASE_TABLET | Freq: Every day | ORAL | 3 refills | Status: AC
  Filled 2024-10-23: qty 90, 90d supply, fill #0
  Filled 2024-12-16: qty 90, 90d supply, fill #1

## 2024-10-23 MED ORDER — TRAMADOL HCL 50 MG PO TABS
50.0000 mg | ORAL_TABLET | Freq: Three times a day (TID) | ORAL | 0 refills | Status: AC
  Filled 2024-10-23: qty 90, 30d supply, fill #0

## 2024-10-23 MED ORDER — TRAMADOL HCL 50 MG PO TABS
50.0000 mg | ORAL_TABLET | Freq: Three times a day (TID) | ORAL | 0 refills | Status: AC
  Filled 2024-10-23 – 2024-12-16 (×2): qty 90, 30d supply, fill #0

## 2024-10-23 NOTE — Progress Notes (Signed)
 Patient ID: Manuel Castro, male    DOB: [birth year], 73 y.o.   MRN: [id number]   Assessment & Plan:  Charcot-Marie-Tooth disease -     traMADol  HCl; Take 1 tablet (50 mg total) by mouth in the morning, at noon, and at bedtime.  Dispense: 90 tablet; Refill: 0 -     traMADol  HCl; Take 1 tablet (50 mg total) by mouth in the morning, at noon, and at bedtime.  Dispense: 90 tablet; Refill: 0 -     traMADol  HCl; Take 1 tablet (50 mg total) by mouth in the morning, at noon, and at bedtime.  Dispense: 90 tablet; Refill: 0  Chronic pain syndrome -     traMADol  HCl; Take 1 tablet (50 mg total) by mouth in the morning, at noon, and at bedtime.  Dispense: 90 tablet; Refill: 0 -     traMADol  HCl; Take 1 tablet (50 mg total) by mouth in the morning, at noon, and at bedtime.  Dispense: 90 tablet; Refill: 0 -     traMADol  HCl; Take 1 tablet (50 mg total) by mouth in the morning, at noon, and at bedtime.  Dispense: 90 tablet; Refill: 0  Chronic obstructive pulmonary disease, unspecified COPD type (HCC) -     Ambulatory Referral for Lung Cancer Scre  Gastroesophageal reflux disease, unspecified whether esophagitis present -     Pantoprazole  Sodium; Take 1 tablet (40 mg total) by mouth daily prior to dinner for reflux  Dispense: 90 tablet; Refill: 3  Resistant hypertension  Chronic pancreatitis, unspecified pancreatitis type (HCC)  Cigarette nicotine dependence without complication -     Ambulatory Referral for Lung Cancer Scre  Stage 3a chronic kidney disease (HCC)  Other orders -     Pancrelipase  (Lip-Prot-Amyl); Take 1 capsule (24,000 Units total) by mouth 3 (three) times daily with meals.  Dispense: 200 capsule; Refill: 1      Assessment and Plan Assessment & Plan Resistant hypertension complicated by stage 3a chronic kidney disease Blood pressure remains elevated at 196/98 mmHg, consistent with white coat syndrome. Management is complicated by medication intolerance and renal considerations.  Previous antihypertensives exacerbated pancreatitis and caused gastrointestinal symptoms. He is reluctant to try new medications due to past experiences. Current management includes telmisartan  40 mg daily. He prefers to monitor kidney function through lab tests and lifestyle management. - Continue telmisartan  40 mg daily - Monitor kidney function through lab tests - Avoid NSAIDs due to renal disease  Charcot-Marie-Tooth disease with chronic pain syndrome Chronic pain management includes core exercises, Tai Chi, lidocaine  patches, Voltaren gel, new AFOs, and duloxetine . Tramadol  was previously used but not found effective. He is considering resuming tramadol  for pain management. Scheduled for possible trigger point injections in January. Pain is exacerbated by winter weather and bursitis. - Prescribed tramadol  every 8 hours as needed for pain - Continue current pain management strategies including core exercises, Tai Chi, lidocaine  patches, Voltaren gel, and new AFOs - Will consider trigger point injections in January with Dr. Cornelio   Chronic pancreatitis Managed with Creon . He has a history of medication intolerance, particularly with Veltassa, which caused significant nausea and diarrhea. He prefers to monitor kidney function through lab tests and lifestyle management. - Continue Creon  24,000 to 76,000 units, one capsule three times daily - Will review CMP levels at next visit  Gastroesophageal reflux disease Heartburn has returned, and he is currently taking pantoprazole  40 mg. He has a history of medication intolerance, particularly with Veltassa, which caused significant  nausea and diarrhea. He prefers to monitor kidney function through lab tests and lifestyle management. - Prescribed pantoprazole  40 mg  Cigarette nicotine dependence He smokes a pack of cigarettes in a week and then abstains for a few days. He is attempting to reduce smoking and is aware of the health risks associated with  smoking, including its impact on kidney health. - Encouraged smoking cessation efforts  Chronic obstructive pulmonary disease COPD diagnosis noted in the past, possibly related to smoking history. He reports no current respiratory symptoms and does not require inhalers. A low-dose chest CT is recommended to monitor lung health due to smoking history. - Ordered low-dose chest CT for lung cancer screening and COPD evaluation  General health maintenance He is attempting to reduce smoking and is aware of the health risks associated with smoking, including its impact on kidney health. He is also trying to reduce intake of dark sodas due to potential kidney health risks. - Encouraged lifestyle modifications to improve overall health      Return in about 3 months (around 01/21/2025) for recheck/follow-up.    Subjective:    Chief Complaint  Patient presents with   Medical Management of Chronic Issues    Pt in office for HTN follow up and blood pressure check; pt admits sinus been bothering him; also being off tramadol  isn't working for him and would like to restart prescriptions    HPI Discussed the use of AI scribe software for clinical note transcription with the patient, who gave verbal consent to proceed.  History of Present Illness Manuel Castro is a 73 year old male with Charcot-Marie-Tooth disease and chronic pain who presents for a regular three-month follow-up visit.  He manages his Charcot-Marie-Tooth disease and associated chronic pain with duloxetine , lidocaine  patches, Voltaren gel, core exercises, and Tai Chi. Duloxetine  affects his sleep when taken at night. He has not used tramadol  for the past three months, as it was not significantly helpful, but he now feels it may have been more beneficial than realized and is considering resuming it.  He experiences chronic pain, particularly in his back, knee, and hands, which worsens in cold weather. He has a history of epidural  steroids and joint injections and is considering trigger point injections for pain management. His bursitis is acting up, and his hands are particularly painful.  He takes pantoprazole  40 mg for heartburn, which has recently returned. He also takes Creon  for chronic pancreatitis, which he finds helpful.  He has a history of COPD, noted on an x-ray in April 2022, but reports no current breathing difficulties. He smokes a few cigarettes a day and is considering a low-dose chest CT in the future to monitor his lung health.  He is on telmisartan  40 mg daily for hypertension but experiences white coat syndrome, leading to elevated blood pressure readings in the office. He has tried various antihypertensives in the past, which have exacerbated his pancreatitis and caused gastrointestinal symptoms. He is cautious about trying new medications due to these experiences.  He has stage 3A chronic kidney disease and is aware of the need to avoid NSAIDs. He had a negative experience with [facility] and prefers to monitor his kidney function through lab tests and lifestyle management. He previously tried Veltassa, which caused significant nausea and diarrhea, leading to its discontinuation.  He recently experienced a three-week period of illness with gout, a cold, and sinus issues, which prevented him from going to the gym. He is trying to reduce his [employer]  intake due to concerns about kidney health. He does not vape or smoke heavily, and he does not use inhalers.     Past Medical History:  Diagnosis Date   Allergy 1957   bee stings   Arthritis 1983   Blood transfusion without reported diagnosis 2022   Cataract 2024   Chronic kidney disease 1995   one small kidney   Chronic pain    b/l knees   CMT (Charcot-Marie-Tooth disease)    Depression 1970   GERD (gastroesophageal reflux disease) 1992   Glaucoma 1970   controlled by eye drops   HTN (hypertension)    Ulcer 1999   bleeding ulcers -  cauterized    Past Surgical History:  Procedure Laterality Date   ADENOIDECTOMY     ESOPHAGOGASTRODUODENOSCOPY N/A 02/23/2021   Procedure: ESOPHAGOGASTRODUODENOSCOPY (EGD);  Surgeon: Dianna Specking, MD;  Location: [facility] ENDOSCOPY;  Service: Endoscopy;  Laterality: N/A;   FOOT SURGERY Bilateral    FRACTURE SURGERY  8016,8014   feet   HERNIA REPAIR  1987, 1989   NECK SURGERY     TONSILLECTOMY      Family History  Problem Relation Age of Onset   Cancer Mother    Heart attack Mother    Heart disease Mother    Diabetes Father    Heart attack Father    Heart disease Father     Social History[1]   Allergies[2]  Review of Systems NEGATIVE UNLESS OTHERWISE INDICATED IN HPI      Objective:     BP (!) 196/98 (BP Location: Right Arm, Patient Position: Sitting, Cuff Size: Normal)   Pulse 64   Temp 97.9 F (36.6 C) (Temporal)   Ht 5' 7 (1.702 m)   Wt 143 lb 3.2 oz (65 kg)   SpO2 99%   BMI 22.43 kg/m   Wt Readings from Last 3 Encounters:  10/23/24 143 lb 3.2 oz (65 kg)  08/14/24 134 lb (60.8 kg)  07/22/24 134 lb 9.6 oz (61.1 kg)    BP Readings from Last 3 Encounters:  10/23/24 (!) 196/98  08/14/24 (!) 191/107  07/22/24 (!) 188/90     Physical Exam Vitals and nursing note reviewed.  Constitutional:      General: He is not in acute distress.    Appearance: Normal appearance. He is not toxic-appearing.  HENT:     Head: Normocephalic and atraumatic.     Right Ear: External ear normal.     Left Ear: External ear normal.  Eyes:     Extraocular Movements: Extraocular movements intact.     Conjunctiva/sclera: Conjunctivae normal.     Pupils: Pupils are equal, round, and reactive to light.  Cardiovascular:     Rate and Rhythm: Normal rate and regular rhythm.  Pulmonary:     Effort: Pulmonary effort is normal.  Abdominal:     General: Bowel sounds are normal.  Musculoskeletal:        General: Deformity present. Normal range of motion.     Cervical back: Normal  range of motion and neck supple.  Skin:    General: Skin is warm and dry.  Neurological:     Mental Status: He is alert and oriented to person, place, and time.     Cranial Nerves: No cranial nerve deficit.     Motor: Weakness (bilateral lower extremities; wearing braces on each leg) present.     Gait: Gait abnormal.     Comments: Normal grip strength bilateral upper extremities  Psychiatric:  Mood and Affect: Mood normal.             Melah Ebling M Promise Weldin, PA-C     [1]  Social History Tobacco Use   Smoking status: Every Day    Current packs/day: 0.25    Average packs/day: 0.3 packs/day for 10.0 years (2.5 ttl pk-yrs)    Types: Cigarettes   Smokeless tobacco: Never  Vaping Use   Vaping status: Never Used  Substance Use Topics   Alcohol use: Not Currently   Drug use: Not Currently    Types: Marijuana  [2]  Allergies Allergen Reactions   Bee Venom Shortness Of Breath and Swelling   Amlodipine  Besylate Nausea And Vomiting    amlodipine  besylate - severe vomiting per patient    Aspirin Other (See Comments)    Other Reaction(s): bleeds easily  aspirin   Lisinopril  Nausea And Vomiting    Severe vomiting - and flared up his pancreatitis

## 2024-11-09 ENCOUNTER — Telehealth: Payer: Self-pay

## 2024-11-09 NOTE — Telephone Encounter (Signed)
 Called and spoke to patient to schedule [facility] CT. Pt does not qualify per CMS guidelines. See referral notes.

## 2024-11-16 ENCOUNTER — Encounter: Attending: Physical Medicine and Rehabilitation | Admitting: Physical Medicine and Rehabilitation

## 2024-11-16 ENCOUNTER — Encounter: Payer: Self-pay | Admitting: Physical Medicine and Rehabilitation

## 2024-11-16 ENCOUNTER — Other Ambulatory Visit (HOSPITAL_BASED_OUTPATIENT_CLINIC_OR_DEPARTMENT_OTHER): Payer: Self-pay

## 2024-11-16 VITALS — BP 204/84 | HR 73 | Ht 67.0 in | Wt 146.0 lb

## 2024-11-16 DIAGNOSIS — G600 Hereditary motor and sensory neuropathy: Secondary | ICD-10-CM | POA: Insufficient documentation

## 2024-11-16 DIAGNOSIS — R2689 Other abnormalities of gait and mobility: Secondary | ICD-10-CM | POA: Diagnosis not present

## 2024-11-16 DIAGNOSIS — Z9181 History of falling: Secondary | ICD-10-CM | POA: Diagnosis not present

## 2024-11-16 DIAGNOSIS — G479 Sleep disorder, unspecified: Secondary | ICD-10-CM | POA: Diagnosis not present

## 2024-11-16 DIAGNOSIS — M545 Low back pain, unspecified: Secondary | ICD-10-CM | POA: Diagnosis not present

## 2024-11-16 DIAGNOSIS — M7918 Myalgia, other site: Secondary | ICD-10-CM | POA: Diagnosis not present

## 2024-11-16 MED ORDER — LIDOCAINE HCL 1 % IJ SOLN: 3.0000 mL | Freq: Once | INTRAMUSCULAR | Status: AC

## 2024-11-16 MED ORDER — DULOXETINE HCL 60 MG PO CPEP
60.0000 mg | ORAL_CAPSULE | Freq: Every day | ORAL | 1 refills | Status: AC
  Filled 2024-11-16: qty 90, 90d supply, fill #0

## 2024-11-16 NOTE — Patient Instructions (Signed)
 Plan: Will try Cymbalta  in the AM- 60 mg for 1 month-    2.   Try without AM gabapentin - so we can see if that helps pain more and improves balance.    3. Patient here for trigger point injections for  Consent done and on chart.  Cleaned areas with alcohol and injected using a 27 gauge 1.5 inch needle  Injected 3cc- none wasted Using 1% Lidocaine  with no EPI  Upper traps B/L and 1 extra time on L Levators Posterior scalenes Middle scalenes Splenius Capitus Pectoralis Major Rhomboids Infraspinatus Teres Major/minor Thoracic paraspinals B/L  Lumbar paraspinals B/L x3 Other injections-    Patient's level of pain prior was  5-6/10 Current level of pain after injections is neck not snapping now- still 6/10  There was no bleeding or complications.  Patient was advised to drink a lot of water on day after injections to flush system Will have increased soreness for 12-48 hours after injections.  Can use Lidocaine   the day AFTER injections Can use theracane massage gun on day of injections in places didn't inject Can use heating pad or ice  4-6 hours AFTER injections  4. F/U- 3 months-
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
## 2024-11-17 ENCOUNTER — Other Ambulatory Visit (HOSPITAL_BASED_OUTPATIENT_CLINIC_OR_DEPARTMENT_OTHER): Payer: Self-pay

## 2024-11-17 ENCOUNTER — Other Ambulatory Visit: Payer: Self-pay | Admitting: Physician Assistant

## 2024-11-17 ENCOUNTER — Other Ambulatory Visit: Payer: Self-pay

## 2024-11-17 MED ORDER — TELMISARTAN 40 MG PO TABS
40.0000 mg | ORAL_TABLET | Freq: Every day | ORAL | 2 refills | Status: AC
  Filled 2024-11-17: qty 30, 30d supply, fill #0
  Filled 2024-12-16: qty 30, 30d supply, fill #1

## 2024-11-19 ENCOUNTER — Other Ambulatory Visit (HOSPITAL_BASED_OUTPATIENT_CLINIC_OR_DEPARTMENT_OTHER): Payer: Self-pay

## 2024-11-19 ENCOUNTER — Encounter: Payer: Self-pay | Admitting: Physical Medicine and Rehabilitation

## 2024-11-21 ENCOUNTER — Other Ambulatory Visit (HOSPITAL_BASED_OUTPATIENT_CLINIC_OR_DEPARTMENT_OTHER): Payer: Self-pay

## 2024-12-16 ENCOUNTER — Other Ambulatory Visit (HOSPITAL_BASED_OUTPATIENT_CLINIC_OR_DEPARTMENT_OTHER): Payer: Self-pay

## 2024-12-16 ENCOUNTER — Other Ambulatory Visit: Payer: Self-pay

## 2025-01-21 ENCOUNTER — Ambulatory Visit: Admitting: Physician Assistant

## 2025-03-10 ENCOUNTER — Encounter: Admitting: Physical Medicine and Rehabilitation

## 2025-06-08 ENCOUNTER — Ambulatory Visit
